# Patient Record
Sex: Female | Born: 1975
Health system: Southern US, Community
[De-identification: ages and names within clinical notes are randomized; demographics above are authoritative.]

## PROBLEM LIST (undated history)

## (undated) DIAGNOSIS — F329 Major depressive disorder, single episode, unspecified: Secondary | ICD-10-CM

## (undated) DIAGNOSIS — F32A Depression, unspecified: Secondary | ICD-10-CM

## (undated) HISTORY — DX: Depression, unspecified: F32.A

## (undated) HISTORY — PX: DILATION AND CURETTAGE OF UTERUS: SHX78

## (undated) HISTORY — PX: WISDOM TOOTH EXTRACTION: SHX21

## (undated) HISTORY — PX: PILONIDAL CYST EXCISION: SHX744

## (undated) HISTORY — DX: Major depressive disorder, single episode, unspecified: F32.9

---

## 2011-08-14 ENCOUNTER — Inpatient Hospital Stay: Payer: Self-pay

## 2014-01-29 ENCOUNTER — Ambulatory Visit: Payer: Self-pay | Admitting: Obstetrics and Gynecology

## 2014-01-29 LAB — CBC WITH DIFFERENTIAL/PLATELET
BASOS PCT: 0.6 %
Basophil #: 0.1 10*3/uL (ref 0.0–0.1)
EOS ABS: 0 10*3/uL (ref 0.0–0.7)
Eosinophil %: 0.2 %
HCT: 38.5 % (ref 35.0–47.0)
HGB: 12.5 g/dL (ref 12.0–16.0)
Lymphocyte #: 1.5 10*3/uL (ref 1.0–3.6)
Lymphocyte %: 12.7 %
MCH: 30.9 pg (ref 26.0–34.0)
MCHC: 32.5 g/dL (ref 32.0–36.0)
MCV: 95 fL (ref 80–100)
Monocyte #: 0.6 x10 3/mm (ref 0.2–0.9)
Monocyte %: 5.5 %
NEUTROS ABS: 9.4 10*3/uL — AB (ref 1.4–6.5)
NEUTROS PCT: 81 %
PLATELETS: 258 10*3/uL (ref 150–440)
RBC: 4.06 10*6/uL (ref 3.80–5.20)
RDW: 13.5 % (ref 11.5–14.5)
WBC: 11.7 10*3/uL — ABNORMAL HIGH (ref 3.6–11.0)

## 2014-01-30 ENCOUNTER — Inpatient Hospital Stay: Payer: Self-pay | Admitting: Obstetrics and Gynecology

## 2014-01-31 LAB — HEMATOCRIT: HCT: 35.3 % (ref 35.0–47.0)

## 2015-04-10 NOTE — Op Note (Signed)
PATIENT NAME:  Adrienne Richards, Adrienne Richards MR#:  [id number] DATE OF BIRTH:  09-03-1976  DATE OF PROCEDURE:  01/30/2014  PREOPERATIVE DIAGNOSES:  1.  Term intrauterine pregnancy at 39 weeks, 3 days gestation.  2.  History of prior birth trauma with fourth degree laceration.   POSTOPERATIVE DIAGNOSES: 1.  Term intrauterine pregnancy at 39 weeks, 3 days gestation.  2.  History of prior birth trauma with fourth degree laceration.   OPERATION PERFORMED: Primary low transverse cesarean section via Pfannenstiel skin incision.   PRIMARY SURGEON: [name], M.D.   ANESTHESIA: Spinal.  PREOPERATIVE ANTIBIOTICS: 2 grams Ancef.   DRAINS OR TUBES: Foley to gravity drainage, On-Q catheter system.   IMPLANTS: None.   ESTIMATED BLOOD LOSS: 800 mL.  OPERATIVE FLUIDS: 1 L of crystalloid.   URINE OUTPUT: 100 mL of clear urine.   SPECIMENS REMOVED: None.   COMPLICATIONS: None.   INTRAOPERATIVE FINDINGS: Normal uterus, ovaries and tubes. Delivery resulted in the birth of a live born female infant weighing 3670 grams, 8 pounds 1 ounce, Apgars 8 and 9.   THE PATIENT CONDITION FOLLOWING PROCEDURE: Stable.   PROCEDURE IN DETAIL: Risks, benefits, and alternatives of the procedure were discussed with the patient prior to proceeding to the operating room. The patient was taken back to the operating room where she was placed under spinal anesthesia. She was positioned in the supine position, prepped and draped in the usual sterile fashion. A timeout procedure was performed. Prior to proceeding with the case, the level of anesthetic was checked and noted to be adequate. A Pfannenstiel skin incision was made 2 cm above the pubic symphysis, carried down sharply to the level of the rectus fascia using the scalpel. The fascia was incised in the midline. The fascial incision was extended using Mayo scissors. The superior border of the rectus fascia was grasped with 2 Kocher clamps. The underlying rectus muscles were  dissected off the rectus fascia bluntly. The inferior portion of the rectus fascia was also dissected off the rectus muscles in a similar fashion. The median raphe was incised using Mayo scissors. The midline was identified and entered bluntly.   Once the peritoneum had been entered, the peritoneal incision was extended using manual traction. A bladder blade was placed. The uterus was noted to be in non-rotated position. A low transverse incision was made on the uterus using the scalpel. The uterus was entered bluntly using a hemostat. The hysterotomy incision was extended using manual traction. Amniotomy was performed using an Allis clamp noting clear fluid. Placing the operator's hand into the hysterotomy incision, the infant was noted to be in the OA position. The vertex was grasped, flexed, brought to the incision and delivered atraumatically using fundal pressure. The remainder of the body delivered with ease. The infant was suctioned, cord was clamped and cut and the infant was passed to the awaiting pediatricians. Cord blood was obtained. The placenta was delivered using manual extraction. The uterus was then exteriorized, wiped clean of clots and debris. The hysterotomy incision was repaired using a two layer closure with #1 Vicryl with the first being a running locked, the second a horizontal imbricating. Following closure of the hysterotomy, the uterus was returned to the abdomen. The peritoneal gutters were wiped clean of clots and debris using two moist laps. The hysterotomy incision was reinspected and noted to be hemostatic. The superior border of the rectus fascia was grasped with a Kocher clamp. The On-Q catheters were placed subfascially per the usual protocol. The  fascia was closed using a looped #1 PDS in a running fashion. Following fascial closure, the incision was irrigated and hemostasis achieved using the Bovie. Skin was closed using Insorb staples. The incision was dressed with Dermabond.  Each On-Q catheter was bolused with 5 mL of 0.5% bupivacaine each. Sponge, needle, and instrument counts were correct x 2. The patient tolerated the procedure well and was taken to the recovery room in stable condition.   ____________________________ Florina OuAndreas M. Bonney AidStaebler, MD ams:aw D: 01/30/2014 11:16:46 ET T: 01/30/2014 11:38:17 ET JOB#: [id number]  cc: Florina OuAndreas M. Bonney AidStaebler, MD, <Dictator> Carmel SacramentoANDREAS Cathrine MusterM Fani Rotondo MD ELECTRONICALLY SIGNED 02/04/2014 1:00

## 2016-05-29 DIAGNOSIS — N912 Amenorrhea, unspecified: Secondary | ICD-10-CM | POA: Diagnosis not present

## 2016-05-29 DIAGNOSIS — L298 Other pruritus: Secondary | ICD-10-CM | POA: Diagnosis not present

## 2016-06-15 DIAGNOSIS — L282 Other prurigo: Secondary | ICD-10-CM | POA: Diagnosis not present

## 2016-06-15 DIAGNOSIS — Z7689 Persons encountering health services in other specified circumstances: Secondary | ICD-10-CM | POA: Diagnosis not present

## 2016-06-15 DIAGNOSIS — B029 Zoster without complications: Secondary | ICD-10-CM | POA: Diagnosis not present

## 2016-06-30 DIAGNOSIS — R21 Rash and other nonspecific skin eruption: Secondary | ICD-10-CM | POA: Diagnosis not present

## 2016-07-19 DIAGNOSIS — O09529 Supervision of elderly multigravida, unspecified trimester: Secondary | ICD-10-CM | POA: Diagnosis not present

## 2016-07-19 DIAGNOSIS — Z113 Encounter for screening for infections with a predominantly sexual mode of transmission: Secondary | ICD-10-CM | POA: Diagnosis not present

## 2016-07-19 DIAGNOSIS — Z36 Encounter for antenatal screening of mother: Secondary | ICD-10-CM | POA: Diagnosis not present

## 2016-07-19 DIAGNOSIS — Z3490 Encounter for supervision of normal pregnancy, unspecified, unspecified trimester: Secondary | ICD-10-CM | POA: Diagnosis not present

## 2016-07-19 DIAGNOSIS — Z3A01 Less than 8 weeks gestation of pregnancy: Secondary | ICD-10-CM | POA: Diagnosis not present

## 2016-07-19 DIAGNOSIS — O09899 Supervision of other high risk pregnancies, unspecified trimester: Secondary | ICD-10-CM | POA: Diagnosis not present

## 2016-07-21 DIAGNOSIS — Z3490 Encounter for supervision of normal pregnancy, unspecified, unspecified trimester: Secondary | ICD-10-CM | POA: Diagnosis not present

## 2016-07-27 DIAGNOSIS — O09899 Supervision of other high risk pregnancies, unspecified trimester: Secondary | ICD-10-CM | POA: Diagnosis not present

## 2016-07-27 DIAGNOSIS — O200 Threatened abortion: Secondary | ICD-10-CM | POA: Diagnosis not present

## 2016-07-27 DIAGNOSIS — Z3A08 8 weeks gestation of pregnancy: Secondary | ICD-10-CM | POA: Diagnosis not present

## 2016-07-27 DIAGNOSIS — O09521 Supervision of elderly multigravida, first trimester: Secondary | ICD-10-CM | POA: Diagnosis not present

## 2016-08-02 DIAGNOSIS — Z3A09 9 weeks gestation of pregnancy: Secondary | ICD-10-CM | POA: Diagnosis not present

## 2016-08-02 DIAGNOSIS — O200 Threatened abortion: Secondary | ICD-10-CM | POA: Diagnosis not present

## 2016-08-03 ENCOUNTER — Encounter
Admission: RE | Admit: 2016-08-03 | Discharge: 2016-08-03 | Disposition: A | Discharge status: Home / Self Care | Payer: BLUE CROSS/BLUE SHIELD | Source: Ambulatory Visit | Attending: Obstetrics and Gynecology | Admitting: Obstetrics and Gynecology

## 2016-08-03 DIAGNOSIS — Z01810 Encounter for preprocedural cardiovascular examination: Secondary | ICD-10-CM | POA: Insufficient documentation

## 2016-08-03 DIAGNOSIS — O021 Missed abortion: Secondary | ICD-10-CM | POA: Diagnosis not present

## 2016-08-03 LAB — CBC
HEMATOCRIT: 38.9 % (ref 35.0–47.0)
HEMOGLOBIN: 13.8 g/dL (ref 12.0–16.0)
MCH: 33.3 pg (ref 26.0–34.0)
MCHC: 35.3 g/dL (ref 32.0–36.0)
MCV: 94.1 fL (ref 80.0–100.0)
Platelets: 296 10*3/uL (ref 150–440)
RBC: 4.14 MIL/uL (ref 3.80–5.20)
RDW: 14.5 % (ref 11.5–14.5)
WBC: 9.5 10*3/uL (ref 3.6–11.0)

## 2016-08-03 LAB — COMPREHENSIVE METABOLIC PANEL
ALBUMIN: 4.1 g/dL (ref 3.5–5.0)
ALK PHOS: 35 U/L — AB (ref 38–126)
ALT: 9 U/L — AB (ref 14–54)
ANION GAP: 8 (ref 5–15)
AST: 21 U/L (ref 15–41)
BUN: 10 mg/dL (ref 6–20)
CALCIUM: 9 mg/dL (ref 8.9–10.3)
CO2: 25 mmol/L (ref 22–32)
CREATININE: 0.51 mg/dL (ref 0.44–1.00)
Chloride: 104 mmol/L (ref 101–111)
GFR calc non Af Amer: >60 mL/min (ref >60)
GLUCOSE: 78 mg/dL (ref 65–99)
Potassium: 3.9 mmol/L (ref 3.5–5.1)
Sodium: 137 mmol/L (ref 135–145)
Total Bilirubin: 0.6 mg/dL (ref 0.3–1.2)
Total Protein: 7.2 g/dL (ref 6.5–8.1)

## 2016-08-03 LAB — TYPE AND SCREEN
ABO/RH(D): A NEG
ANTIBODY SCREEN: NEGATIVE
Extend sample reason: UNDETERMINED

## 2016-08-03 NOTE — Patient Instructions (Signed)
  Your procedure is scheduled on: 08/08/16 Tues. Report to Day Surgery. To find out your arrival time please call [phone] between 1PM - 3PM on Mon. 08/07/16.  Remember: Instructions that are not followed completely may result in serious medical risk, up to and including death, or upon the discretion of your surgeon and anesthesiologist your surgery may need to be rescheduled.    _x___ 1. Do not eat food or drink liquids after midnight. No gum chewing or hard candies.     _x___ 2. No Alcohol for 24 hours before or after surgery.   ____ 3. Do Not Smoke For 24 Hours Prior to Your Surgery.   ____ 4. Bring all medications with you on the day of surgery if instructed.    __x__ 5. Notify your doctor if there is any change in your medical condition     (cold, fever, infections).       Do not wear jewelry, make-up, hairpins, clips or nail polish.  Do not wear lotions, powders, or perfumes. You may wear deodorant.  Do not shave 48 hours prior to surgery. Men may shave face and neck.  Do not bring valuables to the hospital.    [facility] responsible for any belongings or valuables.               Contacts, dentures or bridgework may not be worn into surgery.  Leave your suitcase in the car. After surgery it may be brought to your room.  For patients admitted to the hospital, discharge time is determined by your                treatment team.   Patients discharged the day of surgery will not be allowed to drive home.   Please read over the following fact sheets that you were given:   Surgical Site Infection Prevention   ____ Take these medicines the morning of surgery with A SIP OF WATER:    1.   2.   3.   4.  5.  6.  ____ Fleet Enema (as directed)   ____ Use CHG Soap as directed  ____ Use inhalers on the day of surgery  ____ Stop metformin 2 days prior to surgery    ____ Take 1/2 of usual insulin dose the night before surgery and none on the morning of surgery.    ____ Stop Coumadin/Plavix/aspirin on   ____ Stop Anti-inflammatories on    ____ Stop supplements until after surgery.    ____ Bring C-Pap to the hospital.

## 2016-08-08 ENCOUNTER — Ambulatory Visit: Payer: BLUE CROSS/BLUE SHIELD | Admitting: Anesthesiology

## 2016-08-08 ENCOUNTER — Ambulatory Visit
Admission: RE | Admit: 2016-08-08 | Discharge: 2016-08-08 | Disposition: A | Discharge status: Home / Self Care | Payer: BLUE CROSS/BLUE SHIELD | Source: Ambulatory Visit | Attending: Obstetrics and Gynecology | Admitting: Obstetrics and Gynecology

## 2016-08-08 ENCOUNTER — Encounter
Admission: RE | Disposition: A | Discharge status: Home / Self Care | Payer: Self-pay | Source: Ambulatory Visit | Attending: Obstetrics and Gynecology

## 2016-08-08 DIAGNOSIS — Z885 Allergy status to narcotic agent status: Secondary | ICD-10-CM | POA: Diagnosis not present

## 2016-08-08 DIAGNOSIS — O021 Missed abortion: Secondary | ICD-10-CM | POA: Insufficient documentation

## 2016-08-08 DIAGNOSIS — Z8249 Family history of ischemic heart disease and other diseases of the circulatory system: Secondary | ICD-10-CM | POA: Diagnosis not present

## 2016-08-08 DIAGNOSIS — Z79899 Other long term (current) drug therapy: Secondary | ICD-10-CM | POA: Diagnosis not present

## 2016-08-08 DIAGNOSIS — O020 Blighted ovum and nonhydatidiform mole: Secondary | ICD-10-CM | POA: Insufficient documentation

## 2016-08-08 DIAGNOSIS — Z888 Allergy status to other drugs, medicaments and biological substances status: Secondary | ICD-10-CM | POA: Diagnosis not present

## 2016-08-08 DIAGNOSIS — Z9889 Other specified postprocedural states: Secondary | ICD-10-CM

## 2016-08-08 HISTORY — PX: DILATION AND EVACUATION: SHX1459

## 2016-08-08 LAB — TYPE AND SCREEN
ABO/RH(D): A NEG
ANTIBODY SCREEN: NEGATIVE

## 2016-08-08 SURGERY — DILATION AND EVACUATION, UTERUS
Anesthesia: General

## 2016-08-08 MED ORDER — HYDROCODONE-ACETAMINOPHEN 5-325 MG PO TABS: 1.0000 | ORAL_TABLET | Freq: Four times a day (QID) | ORAL | 0 refills | Status: DC | PRN

## 2016-08-08 MED ORDER — FAMOTIDINE 20 MG PO TABS
ORAL_TABLET | ORAL | Status: AC
  Administered 2016-08-08: 20 mg via ORAL
  Filled 2016-08-08: qty 1

## 2016-08-08 MED ORDER — DEXAMETHASONE SODIUM PHOSPHATE 10 MG/ML IJ SOLN
INTRAMUSCULAR | Status: DC | PRN
  Administered 2016-08-08: 10 mg via INTRAVENOUS

## 2016-08-08 MED ORDER — FENTANYL CITRATE (PF) 100 MCG/2ML IJ SOLN
25.0000 ug | INTRAMUSCULAR | Status: DC | PRN
  Administered 2016-08-08 (×2): 25 ug via INTRAVENOUS

## 2016-08-08 MED ORDER — OXYCODONE HCL 5 MG PO TABS: 5.0000 mg | ORAL_TABLET | Freq: Once | ORAL | Status: DC | PRN

## 2016-08-08 MED ORDER — LACTATED RINGERS IV SOLN
INTRAVENOUS | Status: DC
  Administered 2016-08-08: 09:00:00 via INTRAVENOUS

## 2016-08-08 MED ORDER — DOXYCYCLINE HYCLATE 100 MG IV SOLR
200.0000 mg | Freq: Once | INTRAVENOUS | Status: AC
  Administered 2016-08-08: 200 mg via INTRAVENOUS
  Filled 2016-08-08: qty 200

## 2016-08-08 MED ORDER — MIDAZOLAM HCL 2 MG/2ML IJ SOLN
INTRAMUSCULAR | Status: DC | PRN
  Administered 2016-08-08: 2 mg via INTRAVENOUS

## 2016-08-08 MED ORDER — RHO D IMMUNE GLOBULIN 1500 UNIT/2ML IJ SOSY
300.0000 ug | PREFILLED_SYRINGE | Freq: Once | INTRAMUSCULAR | Status: AC
  Administered 2016-08-08: 300 ug via INTRAMUSCULAR
  Filled 2016-08-08: qty 2

## 2016-08-08 MED ORDER — OXYCODONE HCL 5 MG/5ML PO SOLN: 5.0000 mg | Freq: Once | ORAL | Status: DC | PRN

## 2016-08-08 MED ORDER — DOXYCYCLINE HYCLATE 100 MG PO TABS
100.0000 mg | ORAL_TABLET | Freq: Once | ORAL | Status: AC
  Administered 2016-08-08: 100 mg via ORAL
  Filled 2016-08-08: qty 1

## 2016-08-08 MED ORDER — PROPOFOL 10 MG/ML IV BOLUS
INTRAVENOUS | Status: DC | PRN
  Administered 2016-08-08: 150 mg via INTRAVENOUS

## 2016-08-08 MED ORDER — LIDOCAINE HCL (CARDIAC) 20 MG/ML IV SOLN
INTRAVENOUS | Status: DC | PRN
  Administered 2016-08-08: 30 mg via INTRAVENOUS

## 2016-08-08 MED ORDER — IBUPROFEN 600 MG PO TABS: 600.0000 mg | ORAL_TABLET | Freq: Four times a day (QID) | ORAL | 3 refills | Status: DC | PRN

## 2016-08-08 MED ORDER — FENTANYL CITRATE (PF) 100 MCG/2ML IJ SOLN
INTRAMUSCULAR | Status: AC
  Administered 2016-08-08: 25 ug via INTRAVENOUS
  Filled 2016-08-08: qty 2

## 2016-08-08 MED ORDER — PROMETHAZINE HCL 25 MG/ML IJ SOLN: 6.2500 mg | INTRAMUSCULAR | Status: DC | PRN

## 2016-08-08 MED ORDER — FENTANYL CITRATE (PF) 100 MCG/2ML IJ SOLN
INTRAMUSCULAR | Status: DC | PRN
  Administered 2016-08-08: 50 ug via INTRAVENOUS

## 2016-08-08 MED ORDER — FAMOTIDINE 20 MG PO TABS
20.0000 mg | ORAL_TABLET | Freq: Once | ORAL | Status: AC
  Administered 2016-08-08: 20 mg via ORAL

## 2016-08-08 MED ORDER — ONDANSETRON HCL 4 MG/2ML IJ SOLN
INTRAMUSCULAR | Status: DC | PRN
  Administered 2016-08-08: 4 mg via INTRAVENOUS

## 2016-08-08 SURGICAL SUPPLY — 18 items
CATH ROBINSON RED A/P 16FR (CATHETERS) ×2 IMPLANT
FILTER UTR ASPR SPEC (MISCELLANEOUS) ×1 IMPLANT
FLTR UTR ASPR SPEC (MISCELLANEOUS) ×2
GLOVE BIO SURGEON STRL SZ7 (GLOVE) ×12 IMPLANT
GOWN STRL REUS W/ TWL LRG LVL3 (GOWN DISPOSABLE) ×2 IMPLANT
GOWN STRL REUS W/TWL LRG LVL3 (GOWN DISPOSABLE) ×2
KIT BERKELEY 1ST TRIMESTER 3/8 (MISCELLANEOUS) ×2 IMPLANT
KIT RM TURNOVER CYSTO AR (KITS) ×2 IMPLANT
NS IRRIG 500ML POUR BTL (IV SOLUTION) ×2 IMPLANT
PACK DNC HYST (MISCELLANEOUS) ×2 IMPLANT
PAD OB MATERNITY 4.3X12.25 (PERSONAL CARE ITEMS) ×2 IMPLANT
PAD PREP 24X41 OB/GYN DISP (PERSONAL CARE ITEMS) ×2 IMPLANT
SET BERKELEY SUCTION TUBING (SUCTIONS) ×2 IMPLANT
TOWEL OR 17X26 4PK STRL BLUE (TOWEL DISPOSABLE) ×2 IMPLANT
VACURETTE 10 RIGID CVD (CANNULA) IMPLANT
VACURETTE 12 RIGID CVD (CANNULA) IMPLANT
VACURETTE 8 RIGID CVD (CANNULA) IMPLANT
VACURETTE 8MM F TIP (MISCELLANEOUS) ×2 IMPLANT

## 2016-08-08 NOTE — Op Note (Signed)
Patient Name: Donne HazelRobin J Claude Date of Procedure: 08/08/16  Preoperative Diagnosis: 1) 40 y.o. with 6 week blighted ovum  Postoperative Diagnosis: 1) 40 y.o. with 6 week blighted ovum  Operation Performed: Suction dilation and curettage  Indication: Blighted ovum  Anesthesia: General  Primary Surgeon: Vena AustriaAndreas Kijana Estock, MD  Assistant: none  Preoperative Antibiotics: 200mg  doxycyline  Estimated Blood Loss: * No blood loss amount entered *  IV Fluids: 300mL  Urine Output:: ~2020mL straight cath  Drains or Tubes: none  Implants: none  Specimens Removed: products of conception  Complications: none  Intraoperative Findings: minimally enlarged retroverted uterus.  Moderate amount of tissue  Patient Condition: stable  Procedure in Detail:  Patient was taken to the operating room were she was administered general endotracheal anesthesia.  She was positioned in the dorsal lithotomy position utilizing candy cane stirups, prepped and draped in the usual sterile fashion.  Uterus was noted to be minimally enlarged in size, retroverted.   Prior to proceeding with the case a time out was performed.  Attention was turned to the patient's pelvis.  A red rubber catheter was used to empty the patient's bladder.  An operative speculum was placed to allow visualization of the cervix.  The anterior lip of the cervix was grasped with a single tooth tenaculum and the cervix was sequentially dilated using pratt dilators.  A size 8 flexible suction curette was advanced to the uterine fundus, and several passes taken.  Sharp curettage was performed noting good uterine cry throughout the cavity.  A final pass of the suction curette was then undertaken..    The single tooth tenaculum was removed from the cervix.  The tenaculum sites and cervix were noted to be hemostatic before removing the operative speculum.  Sponge needle and instrument counts were corrects times two.  The patient tolerated the procedure  well and was taken to the recovery room in stable condition.

## 2016-08-08 NOTE — Anesthesia Procedure Notes (Signed)
Procedure Name: LMA Insertion Date/Time: 08/08/2016 9:58 AM Performed by: Omer JackWEATHERLY, Adrienne Knock Pre-anesthesia Checklist: Patient identified, Patient being monitored, Timeout performed, Emergency Drugs available and Suction available Patient Re-evaluated:Patient Re-evaluated prior to inductionOxygen Delivery Method: Circle system utilized Preoxygenation: Pre-oxygenation with 100% oxygen Intubation Type: IV induction Ventilation: Mask ventilation without difficulty LMA: LMA inserted LMA Size: 3.5 Tube type: Oral Number of attempts: 1 Placement Confirmation: positive ETCO2 and breath sounds checked- equal and bilateral Tube secured with: Tape Dental Injury: Teeth and Oropharynx as per pre-operative assessment

## 2016-08-08 NOTE — Transfer of Care (Signed)
Immediate Anesthesia Transfer of Care Note  Patient: Adrienne Richards  Procedure(s) Performed: Procedure(s): DILATATION AND EVACUATION (N/A)  Patient Location: PACU  Anesthesia Type:General  Level of Consciousness: patient cooperative and lethargic  Airway & Oxygen Therapy: Patient Spontanous Breathing and Patient connected to face mask oxygen  Post-op Assessment: Report given to RN and Post -op Vital signs reviewed and stable  Post vital signs: Reviewed and stable  Last Vitals:  Vitals:   08/08/16 0804 08/08/16 1035  BP: 103/60 112/73  Pulse: 80 (!) 57  Resp: 14 13  Temp: 36.4 C 36.3 C    Last Pain:  Vitals:   08/08/16 0804  TempSrc: Tympanic         Complications: No apparent anesthesia complications

## 2016-08-08 NOTE — H&P (Signed)
Date of Initial paper H&P: 08/03/16  History reviewed, patient examined, no change in status, stable for surgery.

## 2016-08-08 NOTE — Anesthesia Postprocedure Evaluation (Signed)
Anesthesia Post Note  Patient: Adrienne HazelRobin J Richards  Procedure(s) Performed: Procedure(s) (LRB): DILATATION AND EVACUATION (N/A)  Patient location during evaluation: PACU Anesthesia Type: General Level of consciousness: awake and alert and oriented Pain management: pain level controlled Vital Signs Assessment: post-procedure vital signs reviewed and stable Respiratory status: spontaneous breathing, nonlabored ventilation and respiratory function stable Cardiovascular status: blood pressure returned to baseline and stable Postop Assessment: no signs of nausea or vomiting Anesthetic complications: no    Last Vitals:  Vitals:   08/08/16 1055 08/08/16 1104  BP:  100/78  Pulse: 63 66  Resp: 20 12  Temp:      Last Pain:  Vitals:   08/08/16 1104  TempSrc:   PainSc: 2                  Jacquis Paxton

## 2016-08-08 NOTE — Anesthesia Preprocedure Evaluation (Signed)
Anesthesia Evaluation  Patient identified by MRN, date of birth, ID band Patient awake    Reviewed: Allergy & Precautions, NPO status , Patient's Chart, lab work & pertinent test results  History of Anesthesia Complications Negative for: history of anesthetic complications  Airway Mallampati: II  TM Distance: >3 FB Neck ROM: Full    Dental no notable dental hx.    Pulmonary neg pulmonary ROS, neg sleep apnea,    breath sounds clear to auscultation- rhonchi (-) decreased breath sounds(-) wheezing      Cardiovascular Exercise Tolerance: Good (-) hypertension(-) CAD and (-) Past MI  Rhythm:Regular Rate:Normal - Systolic murmurs and - Diastolic murmurs    Neuro/Psych negative neurological ROS  negative psych ROS   GI/Hepatic negative GI ROS, Neg liver ROS,   Endo/Other  negative endocrine ROSneg diabetes  Renal/GU negative Renal ROS     Musculoskeletal negative musculoskeletal ROS (+)   Abdominal (+) - obese,   Peds  Hematology negative hematology ROS (+)   Anesthesia Other Findings   Reproductive/Obstetrics                             Anesthesia Physical Anesthesia Plan  ASA: I  Anesthesia Plan: General   Post-op Pain Management:    Induction: Intravenous  Airway Management Planned: LMA  Additional Equipment:   Intra-op Plan:   Post-operative Plan:   Informed Consent: I have reviewed the patients History and Physical, chart, labs and discussed the procedure including the risks, benefits and alternatives for the proposed anesthesia with the patient or authorized representative who has indicated his/her understanding and acceptance.   Dental advisory given  Plan Discussed with: CRNA and Anesthesiologist  Anesthesia Plan Comments:         Lab Results  Component Value Date   WBC 9.5 08/03/2016   HGB 13.8 08/03/2016   HCT 38.9 08/03/2016   MCV 94.1 08/03/2016   PLT  296 08/03/2016    Anesthesia Quick Evaluation

## 2016-08-08 NOTE — Discharge Instructions (Signed)
No Intercourse for the first 4 weeks.  Do not drive while taking Vicodin.  Activity as tolerated.   AMBULATORY SURGERY  DISCHARGE INSTRUCTIONS   1) The drugs that you were given will stay in your system until tomorrow so for the next 24 hours you should not:  A) Drive an automobile B) Make any legal decisions C) Drink any alcoholic beverage   2) You may resume regular meals tomorrow.  Today it is better to start with liquids and gradually work up to solid foods.  You may eat anything you prefer, but it is better to start with liquids, then soup and crackers, and gradually work up to solid foods.   3) Please notify your doctor immediately if you have any unusual bleeding, trouble breathing, redness and pain at the surgery site, drainage, fever, or pain not relieved by medication.    4) Additional Instructions:        Please contact your physician with any problems or Same Day Surgery at [phone], Monday through Friday 6 am to 4 pm, or [place] at [facility] Main number at [phone].

## 2016-08-09 LAB — RHOGAM INJECTION: Unit division: 0

## 2016-08-09 LAB — SURGICAL PATHOLOGY

## 2016-09-21 DIAGNOSIS — Z09 Encounter for follow-up examination after completed treatment for conditions other than malignant neoplasm: Secondary | ICD-10-CM | POA: Diagnosis not present

## 2016-09-21 DIAGNOSIS — O039 Complete or unspecified spontaneous abortion without complication: Secondary | ICD-10-CM | POA: Diagnosis not present

## 2016-09-21 DIAGNOSIS — N96 Recurrent pregnancy loss: Secondary | ICD-10-CM | POA: Diagnosis not present

## 2016-10-17 DIAGNOSIS — R946 Abnormal results of thyroid function studies: Secondary | ICD-10-CM | POA: Diagnosis not present

## 2016-10-17 DIAGNOSIS — Z3201 Encounter for pregnancy test, result positive: Secondary | ICD-10-CM | POA: Diagnosis not present

## 2016-10-17 DIAGNOSIS — Z331 Pregnant state, incidental: Secondary | ICD-10-CM | POA: Diagnosis not present

## 2016-10-17 DIAGNOSIS — Z8759 Personal history of other complications of pregnancy, childbirth and the puerperium: Secondary | ICD-10-CM | POA: Diagnosis not present

## 2016-10-19 DIAGNOSIS — O09299 Supervision of pregnancy with other poor reproductive or obstetric history, unspecified trimester: Secondary | ICD-10-CM | POA: Diagnosis not present

## 2016-10-19 DIAGNOSIS — Z3201 Encounter for pregnancy test, result positive: Secondary | ICD-10-CM | POA: Diagnosis not present

## 2016-10-19 DIAGNOSIS — Z331 Pregnant state, incidental: Secondary | ICD-10-CM | POA: Diagnosis not present

## 2016-10-26 DIAGNOSIS — Z3687 Encounter for antenatal screening for uncertain dates: Secondary | ICD-10-CM | POA: Diagnosis not present

## 2016-10-26 DIAGNOSIS — Z3A01 Less than 8 weeks gestation of pregnancy: Secondary | ICD-10-CM | POA: Diagnosis not present

## 2016-11-01 DIAGNOSIS — Z3490 Encounter for supervision of normal pregnancy, unspecified, unspecified trimester: Secondary | ICD-10-CM | POA: Diagnosis not present

## 2016-11-01 DIAGNOSIS — Z113 Encounter for screening for infections with a predominantly sexual mode of transmission: Secondary | ICD-10-CM | POA: Diagnosis not present

## 2016-11-16 DIAGNOSIS — O34219 Maternal care for unspecified type scar from previous cesarean delivery: Secondary | ICD-10-CM | POA: Diagnosis not present

## 2016-11-16 DIAGNOSIS — O09299 Supervision of pregnancy with other poor reproductive or obstetric history, unspecified trimester: Secondary | ICD-10-CM | POA: Diagnosis not present

## 2016-11-16 DIAGNOSIS — O09529 Supervision of elderly multigravida, unspecified trimester: Secondary | ICD-10-CM | POA: Diagnosis not present

## 2016-11-16 DIAGNOSIS — Z3A09 9 weeks gestation of pregnancy: Secondary | ICD-10-CM | POA: Diagnosis not present

## 2016-11-20 DIAGNOSIS — O0991 Supervision of high risk pregnancy, unspecified, first trimester: Secondary | ICD-10-CM | POA: Diagnosis not present

## 2016-11-20 DIAGNOSIS — Z3A09 9 weeks gestation of pregnancy: Secondary | ICD-10-CM | POA: Diagnosis not present

## 2016-12-25 DIAGNOSIS — L2089 Other atopic dermatitis: Secondary | ICD-10-CM | POA: Diagnosis not present

## 2016-12-29 DIAGNOSIS — Z209 Contact with and (suspected) exposure to unspecified communicable disease: Secondary | ICD-10-CM | POA: Diagnosis not present

## 2017-01-02 DIAGNOSIS — O09529 Supervision of elderly multigravida, unspecified trimester: Secondary | ICD-10-CM | POA: Diagnosis not present

## 2017-01-02 DIAGNOSIS — Z3A14 14 weeks gestation of pregnancy: Secondary | ICD-10-CM | POA: Diagnosis not present

## 2017-01-02 DIAGNOSIS — O09899 Supervision of other high risk pregnancies, unspecified trimester: Secondary | ICD-10-CM | POA: Diagnosis not present

## 2017-01-23 DIAGNOSIS — O34219 Maternal care for unspecified type scar from previous cesarean delivery: Secondary | ICD-10-CM | POA: Diagnosis not present

## 2017-01-23 DIAGNOSIS — Z3A19 19 weeks gestation of pregnancy: Secondary | ICD-10-CM | POA: Diagnosis not present

## 2017-01-23 DIAGNOSIS — Z369 Encounter for antenatal screening, unspecified: Secondary | ICD-10-CM | POA: Diagnosis not present

## 2017-02-14 ENCOUNTER — Other Ambulatory Visit: Payer: Self-pay | Admitting: Obstetrics & Gynecology

## 2017-02-14 DIAGNOSIS — Z1389 Encounter for screening for other disorder: Secondary | ICD-10-CM

## 2017-02-20 ENCOUNTER — Ambulatory Visit (INDEPENDENT_AMBULATORY_CARE_PROVIDER_SITE_OTHER): Payer: BLUE CROSS/BLUE SHIELD

## 2017-02-20 ENCOUNTER — Encounter: Payer: Self-pay | Admitting: Obstetrics and Gynecology

## 2017-02-20 ENCOUNTER — Ambulatory Visit (INDEPENDENT_AMBULATORY_CARE_PROVIDER_SITE_OTHER): Payer: BLUE CROSS/BLUE SHIELD | Admitting: Obstetrics and Gynecology

## 2017-02-20 VITALS — BP 112/70 | Wt 162.0 lb

## 2017-02-20 DIAGNOSIS — O0992 Supervision of high risk pregnancy, unspecified, second trimester: Secondary | ICD-10-CM

## 2017-02-20 DIAGNOSIS — O26892 Other specified pregnancy related conditions, second trimester: Secondary | ICD-10-CM

## 2017-02-20 DIAGNOSIS — Z36 Encounter for antenatal screening for chromosomal anomalies: Secondary | ICD-10-CM | POA: Diagnosis not present

## 2017-02-20 DIAGNOSIS — Z3A23 23 weeks gestation of pregnancy: Secondary | ICD-10-CM

## 2017-02-20 DIAGNOSIS — O09523 Supervision of elderly multigravida, third trimester: Secondary | ICD-10-CM | POA: Insufficient documentation

## 2017-02-20 DIAGNOSIS — O09892 Supervision of other high risk pregnancies, second trimester: Secondary | ICD-10-CM

## 2017-02-20 DIAGNOSIS — Z131 Encounter for screening for diabetes mellitus: Secondary | ICD-10-CM

## 2017-02-20 DIAGNOSIS — Z6791 Unspecified blood type, Rh negative: Secondary | ICD-10-CM | POA: Insufficient documentation

## 2017-02-20 DIAGNOSIS — O0993 Supervision of high risk pregnancy, unspecified, third trimester: Secondary | ICD-10-CM | POA: Insufficient documentation

## 2017-02-20 DIAGNOSIS — Z113 Encounter for screening for infections with a predominantly sexual mode of transmission: Secondary | ICD-10-CM

## 2017-02-20 DIAGNOSIS — O09522 Supervision of elderly multigravida, second trimester: Secondary | ICD-10-CM

## 2017-02-20 DIAGNOSIS — Z1389 Encounter for screening for other disorder: Secondary | ICD-10-CM

## 2017-02-20 DIAGNOSIS — Z3A24 24 weeks gestation of pregnancy: Secondary | ICD-10-CM | POA: Diagnosis not present

## 2017-02-20 DIAGNOSIS — O26893 Other specified pregnancy related conditions, third trimester: Secondary | ICD-10-CM | POA: Insufficient documentation

## 2017-02-20 NOTE — Progress Notes (Signed)
Repeat anatomy scan today. 

## 2017-02-20 NOTE — Progress Notes (Addendum)
Anatomy screen complete today. Schedule 28 week labs nv.  No issues today. Discussed TOLAC vs C-section. Still deciding Addendum: patient has changed her mind and would like C-Section.  Prefers Dr. Bonney AidStaebler. Will go ahead and try to set this up.

## 2017-02-27 ENCOUNTER — Telehealth: Payer: Self-pay | Admitting: Obstetrics and Gynecology

## 2017-02-27 NOTE — Telephone Encounter (Signed)
I contacted the patient to schedule her H&P, Pre-Admit Testing, and OR appts. She said she and her husband were considering changing to 06/08/17 w/ Dr. Tiburcio PeaHarris. I gave the patient my phone# and ext and she will call back when a decision is made.

## 2017-02-27 NOTE — Telephone Encounter (Signed)
-----   Message from Conard NovakStephen D Jackson, MD sent at 02/27/2017  1:00 PM EDT ----- Regarding: Schedule Surgery Surgery Booking Request Patient Full Name: Adrienne BiRobin Richards MRN: [id number]  DOB: 09/11/1972  Surgeon: Vena AustriaAndreas Staebler, MD  Requested Surgery Date and Time: 06/12/1017 Primary Diagnosis and Code:  Previous cesarean section, desires repeat (L&D aware)  Secondary Diagnosis and Code: none Surgical Procedure: Repeat Cesarean section Admission Status: Inpatient Length of Surgery: 1 hour Special Case Needs: none H&P: TBD (date) Phone Interview or Office Pre-Admit: TBD Interpreter: n/a Language: n/a Medical Clearance: n/a Special Scheduling Instructions: none

## 2017-02-27 NOTE — Telephone Encounter (Signed)
-----   Message from Stephen D Jackson, MD sent at 02/27/2017  1:00 PM EDT ----- °Regarding: Schedule Surgery °Surgery Booking Request °Patient Full Name: Adrienne Richards °MRN: [id number]  °DOB: 09/11/1972  °Surgeon: Andreas Staebler, MD  °Requested Surgery Date and Time: 06/12/1017 °Primary Diagnosis and Code:  °Previous cesarean section, desires repeat (L&D aware) ° °Secondary Diagnosis and Code: none °Surgical Procedure: Repeat Cesarean section °Admission Status: Inpatient °Length of Surgery: 1 hour °Special Case Needs: none °H&P: TBD (date) °Phone Interview or Office Pre-Admit: TBD °Interpreter: n/a °Language: n/a °Medical Clearance: n/a °Special Scheduling Instructions: none  °

## 2017-02-28 NOTE — Telephone Encounter (Signed)
Patient has decided to stay with Dr Bonney AidStaebler on 06/12/17. Patient is aware of 6/22/185 8:30am H&P with Dr Bonney AidStaebler, 06/11/17 9:15 or 9:30am Pre-Admit Testing, and 06/12/17 OR.

## 2017-03-21 ENCOUNTER — Ambulatory Visit (INDEPENDENT_AMBULATORY_CARE_PROVIDER_SITE_OTHER): Payer: BLUE CROSS/BLUE SHIELD | Admitting: Obstetrics and Gynecology

## 2017-03-21 ENCOUNTER — Encounter: Payer: Self-pay | Admitting: Obstetrics and Gynecology

## 2017-03-21 ENCOUNTER — Other Ambulatory Visit: Payer: BLUE CROSS/BLUE SHIELD

## 2017-03-21 VITALS — BP 116/70 | Wt 164.0 lb

## 2017-03-21 DIAGNOSIS — O0993 Supervision of high risk pregnancy, unspecified, third trimester: Secondary | ICD-10-CM

## 2017-03-21 DIAGNOSIS — Z131 Encounter for screening for diabetes mellitus: Secondary | ICD-10-CM | POA: Diagnosis not present

## 2017-03-21 DIAGNOSIS — O26892 Other specified pregnancy related conditions, second trimester: Secondary | ICD-10-CM

## 2017-03-21 DIAGNOSIS — O09523 Supervision of elderly multigravida, third trimester: Secondary | ICD-10-CM

## 2017-03-21 DIAGNOSIS — O09893 Supervision of other high risk pregnancies, third trimester: Secondary | ICD-10-CM | POA: Diagnosis not present

## 2017-03-21 DIAGNOSIS — Z3A27 27 weeks gestation of pregnancy: Secondary | ICD-10-CM

## 2017-03-21 DIAGNOSIS — O26893 Other specified pregnancy related conditions, third trimester: Secondary | ICD-10-CM

## 2017-03-21 DIAGNOSIS — Z6791 Unspecified blood type, Rh negative: Secondary | ICD-10-CM

## 2017-03-21 DIAGNOSIS — Z113 Encounter for screening for infections with a predominantly sexual mode of transmission: Secondary | ICD-10-CM | POA: Diagnosis not present

## 2017-03-21 DIAGNOSIS — O09892 Supervision of other high risk pregnancies, second trimester: Secondary | ICD-10-CM | POA: Diagnosis not present

## 2017-03-21 DIAGNOSIS — O09522 Supervision of elderly multigravida, second trimester: Secondary | ICD-10-CM

## 2017-03-21 MED ORDER — RHO D IMMUNE GLOBULIN 1500 UNIT/2ML IJ SOSY
300.0000 ug | PREFILLED_SYRINGE | Freq: Once | INTRAMUSCULAR | Status: AC
  Administered 2017-03-21: 300 ug via INTRAMUSCULAR

## 2017-03-21 MED ORDER — TETANUS-DIPHTH-ACELL PERTUSSIS 5-2.5-18.5 LF-MCG/0.5 IM SUSP: 0.5000 mL | Freq: Once | INTRAMUSCULAR | Status: DC

## 2017-03-21 NOTE — Progress Notes (Signed)
28 week labs today. No vb. No lof.  TDaP today, Rhogam today.

## 2017-03-21 NOTE — Addendum Note (Signed)
Addended by: Acquanetta Sit on: 03/21/2017 09:29 AM   Modules accepted: Orders

## 2017-03-22 LAB — 28 WEEKS RH-PANEL
ANTIBODY SCREEN: NEGATIVE
BASOS: 0 %
Basophils Absolute: 0 10*3/uL (ref 0.0–0.2)
EOS (ABSOLUTE): 0.1 10*3/uL (ref 0.0–0.4)
EOS: 1 %
Gestational Diabetes Screen: 104 mg/dL (ref 65–139)
HIV SCREEN 4TH GENERATION: NONREACTIVE
Hematocrit: 32.7 % — ABNORMAL LOW (ref 34.0–46.6)
Hemoglobin: 11.2 g/dL (ref 11.1–15.9)
IMMATURE GRANS (ABS): 0.1 10*3/uL (ref 0.0–0.1)
Immature Granulocytes: 1 %
LYMPHS: 15 %
Lymphocytes Absolute: 1.4 10*3/uL (ref 0.7–3.1)
MCH: 31.5 pg (ref 26.6–33.0)
MCHC: 34.3 g/dL (ref 31.5–35.7)
MCV: 92 fL (ref 79–97)
MONOCYTES: 3 %
Monocytes Absolute: 0.3 10*3/uL (ref 0.1–0.9)
NEUTROS ABS: 7.7 10*3/uL — AB (ref 1.4–7.0)
NEUTROS PCT: 80 %
PLATELETS: 286 10*3/uL (ref 150–379)
RBC: 3.56 x10E6/uL — ABNORMAL LOW (ref 3.77–5.28)
RDW: 13.4 % (ref 12.3–15.4)
RPR Ser Ql: NONREACTIVE
WBC: 9.5 10*3/uL (ref 3.4–10.8)

## 2017-03-23 ENCOUNTER — Encounter: Payer: Self-pay | Admitting: Obstetrics and Gynecology

## 2017-04-04 ENCOUNTER — Ambulatory Visit (INDEPENDENT_AMBULATORY_CARE_PROVIDER_SITE_OTHER): Payer: BLUE CROSS/BLUE SHIELD | Admitting: Obstetrics and Gynecology

## 2017-04-04 DIAGNOSIS — O09523 Supervision of elderly multigravida, third trimester: Secondary | ICD-10-CM

## 2017-04-04 DIAGNOSIS — O0993 Supervision of high risk pregnancy, unspecified, third trimester: Secondary | ICD-10-CM

## 2017-04-04 DIAGNOSIS — Z3A29 29 weeks gestation of pregnancy: Secondary | ICD-10-CM

## 2017-04-04 NOTE — Progress Notes (Signed)
Lump under right arm/pressure

## 2017-04-04 NOTE — Progress Notes (Signed)
Area of concern right axilla consistent with accessory breast tissue, monitor

## 2017-04-18 ENCOUNTER — Ambulatory Visit (INDEPENDENT_AMBULATORY_CARE_PROVIDER_SITE_OTHER): Payer: BLUE CROSS/BLUE SHIELD | Admitting: Advanced Practice Midwife

## 2017-04-18 VITALS — BP 120/70 | Wt 167.0 lb

## 2017-04-18 DIAGNOSIS — Z3A31 31 weeks gestation of pregnancy: Secondary | ICD-10-CM

## 2017-04-18 NOTE — Progress Notes (Signed)
Doing well. Considering BTL at time of c/s. Discussed procedure, recovery, risks/benefits. She will discuss with her husband. No additional right breast concerns today.

## 2017-05-02 ENCOUNTER — Ambulatory Visit (INDEPENDENT_AMBULATORY_CARE_PROVIDER_SITE_OTHER): Payer: BLUE CROSS/BLUE SHIELD | Admitting: Obstetrics and Gynecology

## 2017-05-02 VITALS — BP 110/62 | Wt 172.0 lb

## 2017-05-02 DIAGNOSIS — Z3A33 33 weeks gestation of pregnancy: Secondary | ICD-10-CM

## 2017-05-02 DIAGNOSIS — O0993 Supervision of high risk pregnancy, unspecified, third trimester: Secondary | ICD-10-CM

## 2017-05-02 NOTE — Progress Notes (Signed)
Intense braxton hicks

## 2017-05-18 ENCOUNTER — Ambulatory Visit (INDEPENDENT_AMBULATORY_CARE_PROVIDER_SITE_OTHER): Payer: BLUE CROSS/BLUE SHIELD | Admitting: Obstetrics and Gynecology

## 2017-05-18 VITALS — BP 102/66 | Wt 174.0 lb

## 2017-05-18 DIAGNOSIS — Z3A36 36 weeks gestation of pregnancy: Secondary | ICD-10-CM

## 2017-05-18 DIAGNOSIS — O09523 Supervision of elderly multigravida, third trimester: Secondary | ICD-10-CM

## 2017-05-18 DIAGNOSIS — Z3685 Encounter for antenatal screening for Streptococcus B: Secondary | ICD-10-CM | POA: Diagnosis not present

## 2017-05-18 DIAGNOSIS — O0993 Supervision of high risk pregnancy, unspecified, third trimester: Secondary | ICD-10-CM

## 2017-05-18 NOTE — Progress Notes (Signed)
[ ]   GBS

## 2017-05-18 NOTE — Addendum Note (Signed)
Addended by: Lorrene ReidSTAEBLER, Mkayla Steele M on: 05/18/2017 08:40 AM   Modules accepted: Orders

## 2017-05-20 ENCOUNTER — Encounter: Payer: Self-pay | Admitting: Obstetrics and Gynecology

## 2017-05-20 LAB — STREP GP B NAA: Strep Gp B NAA: POSITIVE — AB

## 2017-05-25 ENCOUNTER — Ambulatory Visit (INDEPENDENT_AMBULATORY_CARE_PROVIDER_SITE_OTHER): Payer: BLUE CROSS/BLUE SHIELD | Admitting: Obstetrics and Gynecology

## 2017-05-25 DIAGNOSIS — Z3A37 37 weeks gestation of pregnancy: Secondary | ICD-10-CM

## 2017-05-25 DIAGNOSIS — Z6791 Unspecified blood type, Rh negative: Secondary | ICD-10-CM

## 2017-05-25 DIAGNOSIS — O09893 Supervision of other high risk pregnancies, third trimester: Secondary | ICD-10-CM

## 2017-05-25 DIAGNOSIS — O0993 Supervision of high risk pregnancy, unspecified, third trimester: Secondary | ICD-10-CM

## 2017-05-25 DIAGNOSIS — O26893 Other specified pregnancy related conditions, third trimester: Secondary | ICD-10-CM

## 2017-05-25 DIAGNOSIS — O09523 Supervision of elderly multigravida, third trimester: Secondary | ICD-10-CM

## 2017-05-25 NOTE — Progress Notes (Signed)
No vb. No lof.  

## 2017-05-31 ENCOUNTER — Encounter: Payer: Self-pay | Admitting: Obstetrics and Gynecology

## 2017-06-04 ENCOUNTER — Ambulatory Visit (INDEPENDENT_AMBULATORY_CARE_PROVIDER_SITE_OTHER): Payer: BLUE CROSS/BLUE SHIELD | Admitting: Advanced Practice Midwife

## 2017-06-04 VITALS — BP 120/80 | Wt 183.0 lb

## 2017-06-04 DIAGNOSIS — Z3A38 38 weeks gestation of pregnancy: Secondary | ICD-10-CM

## 2017-06-04 NOTE — Progress Notes (Signed)
Doing well- minor discomforts of third trimester. Good fetal movement. No LOF, VB. Occasional painful ctx, mostly BH. Labor precautions reviewed. Has pre-op appt scheduled for this Friday.

## 2017-06-08 ENCOUNTER — Ambulatory Visit (INDEPENDENT_AMBULATORY_CARE_PROVIDER_SITE_OTHER): Payer: BLUE CROSS/BLUE SHIELD | Admitting: Obstetrics and Gynecology

## 2017-06-08 ENCOUNTER — Encounter: Payer: Self-pay | Admitting: Obstetrics and Gynecology

## 2017-06-08 VITALS — BP 124/74 | HR 76 | Ht 63.0 in | Wt 187.0 lb

## 2017-06-08 DIAGNOSIS — Z3A38 38 weeks gestation of pregnancy: Secondary | ICD-10-CM

## 2017-06-08 DIAGNOSIS — O09523 Supervision of elderly multigravida, third trimester: Secondary | ICD-10-CM

## 2017-06-08 DIAGNOSIS — Z98891 History of uterine scar from previous surgery: Secondary | ICD-10-CM

## 2017-06-08 NOTE — Progress Notes (Signed)
Obstetrics & Gynecology Surgery H&P    Chief Complaint: Scheduled Surgery   History of Present Illness: Patient is a 41 y.o. G1P0 presenting for scheduled repeat cesarean section & BTL, for the treatment or further evaluation of prior cesarean section and undesired fertility.   Clinic [facility]  Dating L=7  Genetic Screen AFP: neg  NIPS: diploid XY  Anatomic [place] complete  GTT Third trimester:   Rhogam 03/21/17  TDaP vaccine 03/21/17            Flu Shot: declined  Baby Food Breast                           Contraception BTL  CS/VBAC CS  +anti-D abs Received rhogam, too week to titer 1st trrimest, repeat second (neg)  h/o 4th degree lac Scheduled c-section with AMS (had CS with G2)   Prenatal Labs  Blood type: --/--/A NEG (08/22 0815)   Antibody:NEG (08/22 0815)  Rubella:  Immune Varicella: Immune  RPR:   NR  HBsAg:   neg  HIV:   neg  GBS: Positive   Review of Systems:10 point review of systems  Past Medical History:  Past Medical History:  Diagnosis Date  . Depression     Past Surgical History:  Past Surgical History:  Procedure Laterality Date  . CESAREAN SECTION  2015  . DILATION AND EVACUATION N/A 08/08/2016   Procedure: DILATATION AND EVACUATION;  Surgeon: [name], MD;  Location: [facility] ORS;  Service: Gynecology;  Laterality: N/A;  . PILONIDAL CYST EXCISION    . VACUUM ASSISTED VAGINAL DELIVERY  2012    Family History:  Family History  Problem Relation Age of Onset  . Heart disease Maternal Grandmother   . Hypertension Paternal Grandmother   . Migraines Paternal Grandmother     Social History:  Social History   Social History  . Marital status: Married    Spouse name: N/A  . Number of children: N/A  . Years of education: N/A   Occupational History  . Not on file.   Social History Main Topics  . Smoking status: Never Smoker  . Smokeless tobacco: Never Used  . Alcohol use Yes     Comment: occassional when not pregnant  . Drug use: No   . Sexual activity: Yes   Other Topics Concern  . Not on file   Social History Narrative  . No narrative on file    Allergies:  Allergies  Allergen Reactions  . Prozac [Fluoxetine Hcl] Itching  . Demerol [Meperidine] Nausea And Vomiting    Medications: Prior to Admission medications   Medication Sig Start Date End Date Taking? Authorizing Provider  citalopram (CELEXA) 20 MG tablet Take 20 mg by mouth every morning.   Yes [provider]  mometasone (ELOCON) 0.1 % ointment Apply 1 application topically daily as needed (dry skin).   Yes [provider]  Prenatal Vit-Fe Fumarate-FA (PRENATAL MULTIVITAMIN) TABS tablet Take 1 tablet by mouth daily with breakfast.    Yes [provider]  PRESCRIPTION MEDICATION Apply 1 application topically daily as needed (dry skin). Triamcinolone mixed with CeraVe   Yes [provider]    Physical Exam Vitals: Blood pressure 124/74, pulse 76, height 5\' 3"  (1.6 m), weight 187 lb (84.8 kg), last menstrual period 09/08/2016. General: NAD HEENT: normocephalic, anicteric Pulmonary: CTAB, No increased work of breathing Cardiovascular: RRR, distal pulses 2+ Abdomen: Gravid, soft, non-tender Extremities: no edema,  erythema, or tenderness Neurologic: Grossly intact Psychiatric: mood appropriate, affect full  Imaging No results found.  Assessment: 41 y.o. G1P0 presenting for scheduled repeat C-section and BTL  Plan: 1) The patient was counseled regarding risk and benefits to proceeding with Cesarean section to expedite delivery.  Risk of cesarean section were discussed including risk of bleeding and need for potential intraoperative or postoperative blood transfusion with a rate of approximately 5% quoted for all Cesarean sections, risk of injury to adjacent organs including but not limited to bowl and bladder, the need for additional surgical procedures to address such injuries, and the risk of infection. Other  reversible forms of contraception were discussed with patient; she declines all other modalities. Permanent nature of as well as associated risks of the procedure discussed with patient including but not limited to: risk of regret, permanence of method, bleeding, infection, injury to surrounding organs and need for additional procedures.  Failure risk of 0.5-1% with increased risk of ectopic gestation if pregnancy occurs was also discussed with patient.     2) Routine postoperative instructions were reviewed with the patient and her family in detail today including the expected length of recovery and likely postoperative course.  The patient concurred with the proposed plan, giving informed written consent for the surgery today.  Patient instructed on the importance of being NPO after midnight prior to her procedure.  If warranted preoperative prophylactic antibiotics and SCDs ordered on call to the OR to meet SCIP guidelines and adhere to recommendation laid forth in ACOG Practice Bulletin Number 104 May 2009  "Antibiotic Prophylaxis for Gynecologic Procedures".

## 2017-06-11 ENCOUNTER — Encounter
Admission: RE | Admit: 2017-06-11 | Discharge: 2017-06-11 | Disposition: A | Discharge status: Home / Self Care | Payer: BLUE CROSS/BLUE SHIELD | Source: Ambulatory Visit | Attending: Obstetrics and Gynecology | Admitting: Obstetrics and Gynecology

## 2017-06-11 DIAGNOSIS — Z0183 Encounter for blood typing: Secondary | ICD-10-CM | POA: Insufficient documentation

## 2017-06-11 DIAGNOSIS — Z23 Encounter for immunization: Secondary | ICD-10-CM | POA: Diagnosis not present

## 2017-06-11 DIAGNOSIS — D62 Acute posthemorrhagic anemia: Secondary | ICD-10-CM | POA: Diagnosis not present

## 2017-06-11 DIAGNOSIS — O34219 Maternal care for unspecified type scar from previous cesarean delivery: Secondary | ICD-10-CM | POA: Diagnosis not present

## 2017-06-11 DIAGNOSIS — Z01812 Encounter for preprocedural laboratory examination: Secondary | ICD-10-CM

## 2017-06-11 DIAGNOSIS — Z302 Encounter for sterilization: Secondary | ICD-10-CM | POA: Diagnosis not present

## 2017-06-11 DIAGNOSIS — Z3A39 39 weeks gestation of pregnancy: Secondary | ICD-10-CM | POA: Diagnosis not present

## 2017-06-11 DIAGNOSIS — O34211 Maternal care for low transverse scar from previous cesarean delivery: Secondary | ICD-10-CM | POA: Diagnosis not present

## 2017-06-11 DIAGNOSIS — O9081 Anemia of the puerperium: Secondary | ICD-10-CM | POA: Diagnosis not present

## 2017-06-11 LAB — CBC
HCT: 30.9 % — ABNORMAL LOW (ref 35.0–47.0)
Hemoglobin: 10.3 g/dL — ABNORMAL LOW (ref 12.0–16.0)
MCH: 28.3 pg (ref 26.0–34.0)
MCHC: 33.5 g/dL (ref 32.0–36.0)
MCV: 84.6 fL (ref 80.0–100.0)
Platelets: 267 10*3/uL (ref 150–440)
RBC: 3.65 MIL/uL — AB (ref 3.80–5.20)
RDW: 14.2 % (ref 11.5–14.5)
WBC: 10.6 10*3/uL (ref 3.6–11.0)

## 2017-06-11 NOTE — Patient Instructions (Signed)
Your procedure is scheduled on: Tuesday, June 12, 2017 [facility]rd floor at 5:30 am; come through the emergency room entrance.   REMEMBER: Instructions that are not followed completely may result in serious medical risk up to and including death; or upon the discretion of your surgeon and anesthesiologist your surgery may need to be rescheduled.  Do not eat food or drink liquids after midnight. No gum chewing or hard candies  No Alcohol for 24 hours before or after surgery.  No Smoking for 24 hours prior to surgery.  Notify your doctor if there is any change in your medical condition (cold, fever, infection).  Do not wear jewelry, make-up, hairpins, clips or nail polish.  Do not wear lotions, powders, or perfumes.   Do not shave 48 hours prior to surgery.   Do not bring valuables to the hospital. [facility] is not responsible for any belongings or valuables.   TAKE THESE MEDICATIONS THE MORNING OF SURGERY WITH A SIP OF WATER:  1.  CELEXA   Use CHG Soap or wipes as directed on instruction sheet; AVOID BREASTS AND NIPPLES.  Stop Anti-inflammatories such as Advil, Aleve, Ibuprofen, Motrin, Naproxen, Naprosyn, Goodie powder, or aspirin products. (May take Tylenol or Acetaminophen and Celebrex if needed.)  Stop supplements until after surgery. (May continue Vitamin D, Vitamin B, and multivitamin.)

## 2017-06-12 ENCOUNTER — Inpatient Hospital Stay: Payer: BLUE CROSS/BLUE SHIELD | Admitting: Certified Registered Nurse Anesthetist

## 2017-06-12 ENCOUNTER — Encounter
Admission: EM | Disposition: A | Discharge status: Home / Self Care | Payer: Self-pay | Source: Home / Self Care | Attending: Obstetrics and Gynecology

## 2017-06-12 ENCOUNTER — Inpatient Hospital Stay
Admission: RE | Admit: 2017-06-12 | Payer: BLUE CROSS/BLUE SHIELD | Source: Ambulatory Visit | Admitting: Obstetrics and Gynecology

## 2017-06-12 ENCOUNTER — Encounter: Payer: Self-pay | Admitting: Obstetrics and Gynecology

## 2017-06-12 ENCOUNTER — Inpatient Hospital Stay
Admission: EM | Admit: 2017-06-12 | Discharge: 2017-06-15 | DRG: 765 | Disposition: A | Discharge status: Home / Self Care | Payer: BLUE CROSS/BLUE SHIELD | Attending: Obstetrics and Gynecology | Admitting: Obstetrics and Gynecology

## 2017-06-12 DIAGNOSIS — Z302 Encounter for sterilization: Secondary | ICD-10-CM | POA: Diagnosis not present

## 2017-06-12 DIAGNOSIS — O34211 Maternal care for low transverse scar from previous cesarean delivery: Secondary | ICD-10-CM | POA: Diagnosis present

## 2017-06-12 DIAGNOSIS — D62 Acute posthemorrhagic anemia: Secondary | ICD-10-CM | POA: Diagnosis not present

## 2017-06-12 DIAGNOSIS — O9081 Anemia of the puerperium: Secondary | ICD-10-CM | POA: Diagnosis not present

## 2017-06-12 DIAGNOSIS — Z98891 History of uterine scar from previous surgery: Secondary | ICD-10-CM

## 2017-06-12 DIAGNOSIS — O34219 Maternal care for unspecified type scar from previous cesarean delivery: Secondary | ICD-10-CM | POA: Diagnosis not present

## 2017-06-12 DIAGNOSIS — Z3A39 39 weeks gestation of pregnancy: Secondary | ICD-10-CM | POA: Diagnosis not present

## 2017-06-12 HISTORY — PX: TUBAL LIGATION: SHX77

## 2017-06-12 LAB — RPR: RPR Ser Ql: NONREACTIVE

## 2017-06-12 SURGERY — Surgical Case
Anesthesia: Spinal | Wound class: Clean Contaminated

## 2017-06-12 MED ORDER — NALOXONE HCL 0.4 MG/ML IJ SOLN: 0.4000 mg | INTRAMUSCULAR | Status: DC | PRN

## 2017-06-12 MED ORDER — ONDANSETRON HCL 4 MG/2ML IJ SOLN
INTRAMUSCULAR | Status: AC
  Filled 2017-06-12: qty 2

## 2017-06-12 MED ORDER — FENTANYL CITRATE (PF) 100 MCG/2ML IJ SOLN: 25.0000 ug | INTRAMUSCULAR | Status: DC | PRN

## 2017-06-12 MED ORDER — OXYCODONE-ACETAMINOPHEN 5-325 MG PO TABS
1.0000 | ORAL_TABLET | ORAL | Status: DC | PRN
  Administered 2017-06-13 – 2017-06-15 (×7): 1 via ORAL
  Filled 2017-06-12 (×7): qty 1

## 2017-06-12 MED ORDER — LACTATED RINGERS IV SOLN: Freq: Once | INTRAVENOUS | Status: DC

## 2017-06-12 MED ORDER — MORPHINE SULFATE (PF) 2 MG/ML IV SOLN
1.0000 mg | INTRAVENOUS | Status: DC | PRN
Start: 2017-06-12 — End: 2017-06-13

## 2017-06-12 MED ORDER — DEXAMETHASONE SODIUM PHOSPHATE 10 MG/ML IJ SOLN
INTRAMUSCULAR | Status: AC
  Filled 2017-06-12: qty 1

## 2017-06-12 MED ORDER — SIMETHICONE 80 MG PO CHEW
80.0000 mg | CHEWABLE_TABLET | ORAL | Status: DC
  Administered 2017-06-13 – 2017-06-15 (×3): 80 mg via ORAL
  Filled 2017-06-12 (×2): qty 1

## 2017-06-12 MED ORDER — OXYTOCIN 40 UNITS IN LACTATED RINGERS INFUSION - SIMPLE MED
INTRAVENOUS | Status: DC | PRN
  Administered 2017-06-12: 300 mL via INTRAVENOUS
  Administered 2017-06-12: 50 mL via INTRAVENOUS
  Administered 2017-06-12: 250 mL via INTRAVENOUS
  Administered 2017-06-12: 200 mL via INTRAVENOUS

## 2017-06-12 MED ORDER — LIDOCAINE HCL (PF) 2 % IJ SOLN
INTRAMUSCULAR | Status: AC
  Filled 2017-06-12: qty 2

## 2017-06-12 MED ORDER — OXYCODONE-ACETAMINOPHEN 5-325 MG PO TABS
2.0000 | ORAL_TABLET | ORAL | Status: DC | PRN
  Filled 2017-06-12: qty 2

## 2017-06-12 MED ORDER — CEFAZOLIN SODIUM-DEXTROSE 2-4 GM/100ML-% IV SOLN
2.0000 g | INTRAVENOUS | Status: AC
  Administered 2017-06-12: 2 g via INTRAVENOUS
  Filled 2017-06-12: qty 100

## 2017-06-12 MED ORDER — OXYTOCIN 40 UNITS IN LACTATED RINGERS INFUSION - SIMPLE MED: 2.5000 [IU]/h | INTRAVENOUS | Status: AC

## 2017-06-12 MED ORDER — SCOPOLAMINE 1 MG/3DAYS TD PT72: 1.0000 | MEDICATED_PATCH | Freq: Once | TRANSDERMAL | Status: DC

## 2017-06-12 MED ORDER — IBUPROFEN 600 MG PO TABS
600.0000 mg | ORAL_TABLET | Freq: Four times a day (QID) | ORAL | Status: DC
  Administered 2017-06-12 – 2017-06-15 (×11): 600 mg via ORAL
  Filled 2017-06-12 (×11): qty 1

## 2017-06-12 MED ORDER — ONDANSETRON HCL 4 MG/2ML IJ SOLN
4.0000 mg | Freq: Three times a day (TID) | INTRAMUSCULAR | Status: DC | PRN
  Filled 2017-06-12: qty 2

## 2017-06-12 MED ORDER — DEXAMETHASONE SODIUM PHOSPHATE 10 MG/ML IJ SOLN
INTRAMUSCULAR | Status: DC | PRN
  Administered 2017-06-12: 10 mg via INTRAVENOUS

## 2017-06-12 MED ORDER — MORPHINE SULFATE (PF) 0.5 MG/ML IJ SOLN
INTRAMUSCULAR | Status: DC | PRN
  Administered 2017-06-12: .1 mg via EPIDURAL

## 2017-06-12 MED ORDER — EPINEPHRINE PF 1 MG/ML IJ SOLN: INTRAMUSCULAR | Status: DC | PRN

## 2017-06-12 MED ORDER — LACTATED RINGERS IV SOLN
INTRAVENOUS | Status: DC
  Administered 2017-06-12: 06:00:00 via INTRAVENOUS

## 2017-06-12 MED ORDER — EPINEPHRINE PF 1 MG/ML IJ SOLN
INTRAMUSCULAR | Status: DC | PRN
  Administered 2017-06-12: .000001 mg via SUBCUTANEOUS

## 2017-06-12 MED ORDER — CITALOPRAM HYDROBROMIDE 20 MG PO TABS
20.0000 mg | ORAL_TABLET | Freq: Every day | ORAL | Status: DC
Start: 2017-06-13 — End: 2017-06-15
  Administered 2017-06-13 – 2017-06-15 (×3): 20 mg via ORAL
  Filled 2017-06-12 (×3): qty 1

## 2017-06-12 MED ORDER — NALOXONE HCL 2 MG/2ML IJ SOSY
1.0000 ug/kg/h | PREFILLED_SYRINGE | INTRAVENOUS | Status: DC | PRN
  Filled 2017-06-12: qty 2

## 2017-06-12 MED ORDER — ONDANSETRON HCL 4 MG/2ML IJ SOLN: 4.0000 mg | Freq: Once | INTRAMUSCULAR | Status: DC | PRN

## 2017-06-12 MED ORDER — NALBUPHINE HCL 10 MG/ML IJ SOLN: 5.0000 mg | INTRAMUSCULAR | Status: DC | PRN

## 2017-06-12 MED ORDER — DIPHENHYDRAMINE HCL 25 MG PO CAPS: 25.0000 mg | ORAL_CAPSULE | Freq: Four times a day (QID) | ORAL | Status: DC | PRN

## 2017-06-12 MED ORDER — PHENYLEPHRINE 40 MCG/ML (10ML) SYRINGE FOR IV PUSH (FOR BLOOD PRESSURE SUPPORT)
PREFILLED_SYRINGE | INTRAVENOUS | Status: DC | PRN
  Administered 2017-06-12 (×5): 100 ug via INTRAVENOUS
  Administered 2017-06-12 (×4): 200 ug via INTRAVENOUS

## 2017-06-12 MED ORDER — COCONUT OIL OIL: 1.0000 "application " | TOPICAL_OIL | Status: DC | PRN

## 2017-06-12 MED ORDER — BUPIVACAINE 0.25 % ON-Q PUMP DUAL CATH 400 ML
400.0000 mL | INJECTION | Status: DC
  Filled 2017-06-12: qty 400

## 2017-06-12 MED ORDER — NALBUPHINE HCL 10 MG/ML IJ SOLN: 5.0000 mg | Freq: Once | INTRAMUSCULAR | Status: DC | PRN

## 2017-06-12 MED ORDER — LACTATED RINGERS IV SOLN
INTRAVENOUS | Status: DC
  Administered 2017-06-12: 14:00:00 via INTRAVENOUS

## 2017-06-12 MED ORDER — MORPHINE SULFATE (PF) 0.5 MG/ML IJ SOLN
INTRAMUSCULAR | Status: AC
  Filled 2017-06-12: qty 10

## 2017-06-12 MED ORDER — SENNOSIDES-DOCUSATE SODIUM 8.6-50 MG PO TABS: 2.0000 | ORAL_TABLET | ORAL | Status: DC

## 2017-06-12 MED ORDER — DIPHENHYDRAMINE HCL 50 MG/ML IJ SOLN
12.5000 mg | INTRAMUSCULAR | Status: DC | PRN
  Filled 2017-06-12: qty 0.25

## 2017-06-12 MED ORDER — BUPIVACAINE HCL 0.5 % IJ SOLN
50.0000 mL | Freq: Once | INTRAMUSCULAR | Status: DC
  Filled 2017-06-12: qty 50

## 2017-06-12 MED ORDER — DIPHENHYDRAMINE HCL 25 MG PO CAPS
25.0000 mg | ORAL_CAPSULE | ORAL | Status: DC | PRN
  Filled 2017-06-12: qty 1

## 2017-06-12 MED ORDER — SOD CITRATE-CITRIC ACID 500-334 MG/5ML PO SOLN
30.0000 mL | ORAL | Status: AC
  Administered 2017-06-12: 30 mL via ORAL
  Filled 2017-06-12: qty 30

## 2017-06-12 MED ORDER — SODIUM CHLORIDE 0.9% FLUSH: 3.0000 mL | INTRAVENOUS | Status: DC | PRN

## 2017-06-12 MED ORDER — SIMETHICONE 80 MG PO CHEW
80.0000 mg | CHEWABLE_TABLET | ORAL | Status: DC | PRN
  Administered 2017-06-13 – 2017-06-14 (×2): 80 mg via ORAL
  Filled 2017-06-12 (×3): qty 1

## 2017-06-12 MED ORDER — MEPERIDINE HCL 25 MG/ML IJ SOLN: 6.2500 mg | INTRAMUSCULAR | Status: DC | PRN

## 2017-06-12 MED ORDER — MENTHOL 3 MG MT LOZG
1.0000 | LOZENGE | OROMUCOSAL | Status: DC | PRN
  Filled 2017-06-12: qty 9

## 2017-06-12 MED ORDER — OXYTOCIN 40 UNITS IN LACTATED RINGERS INFUSION - SIMPLE MED
INTRAVENOUS | Status: AC
  Filled 2017-06-12: qty 1000

## 2017-06-12 MED ORDER — PRENATAL MULTIVITAMIN CH
1.0000 | ORAL_TABLET | Freq: Every day | ORAL | Status: DC
  Filled 2017-06-12 (×4): qty 1

## 2017-06-12 MED ORDER — IBUPROFEN 600 MG PO TABS
600.0000 mg | ORAL_TABLET | Freq: Four times a day (QID) | ORAL | Status: DC | PRN
  Administered 2017-06-12 (×2): 600 mg via ORAL
  Filled 2017-06-12 (×3): qty 1

## 2017-06-12 MED ORDER — ACETAMINOPHEN 325 MG PO TABS
650.0000 mg | ORAL_TABLET | ORAL | Status: DC | PRN
  Filled 2017-06-12: qty 2

## 2017-06-12 MED ORDER — WITCH HAZEL-GLYCERIN EX PADS: 1.0000 "application " | MEDICATED_PAD | CUTANEOUS | Status: DC | PRN

## 2017-06-12 MED ORDER — BUPIVACAINE IN DEXTROSE 0.75-8.25 % IT SOLN
INTRATHECAL | Status: DC | PRN
  Administered 2017-06-12: 1.7 mL via INTRATHECAL

## 2017-06-12 MED ORDER — SENNOSIDES-DOCUSATE SODIUM 8.6-50 MG PO TABS
2.0000 | ORAL_TABLET | ORAL | Status: DC
  Administered 2017-06-13 – 2017-06-15 (×3): 2 via ORAL
  Filled 2017-06-12 (×3): qty 2

## 2017-06-12 MED ORDER — DIBUCAINE 1 % RE OINT: 1.0000 "application " | TOPICAL_OINTMENT | RECTAL | Status: DC | PRN

## 2017-06-12 MED ORDER — ONDANSETRON HCL 4 MG/2ML IJ SOLN
INTRAMUSCULAR | Status: DC | PRN
  Administered 2017-06-12: 4 mg via INTRAVENOUS

## 2017-06-12 MED ORDER — BUPIVACAINE HCL (PF) 0.5 % IJ SOLN
INTRAMUSCULAR | Status: DC | PRN
  Administered 2017-06-12: 10 mL

## 2017-06-12 MED ORDER — SIMETHICONE 80 MG PO CHEW
80.0000 mg | CHEWABLE_TABLET | Freq: Three times a day (TID) | ORAL | Status: DC
  Administered 2017-06-12 – 2017-06-15 (×7): 80 mg via ORAL
  Filled 2017-06-12 (×10): qty 1

## 2017-06-12 SURGICAL SUPPLY — 29 items
BAG COUNTER SPONGE EZ (MISCELLANEOUS) ×3 IMPLANT
CANISTER SUCT 3000ML PPV (MISCELLANEOUS) ×3 IMPLANT
CATH KIT ON-Q SILVERSOAK 5IN (CATHETERS) ×6 IMPLANT
CHLORAPREP W/TINT 26ML (MISCELLANEOUS) ×6 IMPLANT
DERMABOND ADVANCED (GAUZE/BANDAGES/DRESSINGS) ×1
DERMABOND ADVANCED .7 DNX12 (GAUZE/BANDAGES/DRESSINGS) ×2 IMPLANT
DRSG OPSITE POSTOP 4X10 (GAUZE/BANDAGES/DRESSINGS) ×3 IMPLANT
DRSG TELFA 3X8 NADH (GAUZE/BANDAGES/DRESSINGS) ×3 IMPLANT
ELECT CAUTERY BLADE 6.4 (BLADE) IMPLANT
ELECT REM PT RETURN 9FT ADLT (ELECTROSURGICAL) ×3
ELECTRODE REM PT RTRN 9FT ADLT (ELECTROSURGICAL) ×2 IMPLANT
GAUZE SPONGE 4X4 12PLY STRL (GAUZE/BANDAGES/DRESSINGS) ×3 IMPLANT
GLOVE BIO SURGEON STRL SZ7 (GLOVE) ×12 IMPLANT
GLOVE INDICATOR 7.5 STRL GRN (GLOVE) ×12 IMPLANT
GOWN STRL REUS W/ TWL LRG LVL3 (GOWN DISPOSABLE) ×6 IMPLANT
GOWN STRL REUS W/TWL LRG LVL3 (GOWN DISPOSABLE) ×3
HANDLE YANKAUER SUCT BULB TIP (MISCELLANEOUS) ×3 IMPLANT
NS IRRIG 1000ML POUR BTL (IV SOLUTION) ×3 IMPLANT
PACK C SECTION AR (MISCELLANEOUS) ×3 IMPLANT
PAD OB MATERNITY 4.3X12.25 (PERSONAL CARE ITEMS) ×3 IMPLANT
PAD PREP 24X41 OB/GYN DISP (PERSONAL CARE ITEMS) ×3 IMPLANT
SPONGE LAP 18X18 5 PK (GAUZE/BANDAGES/DRESSINGS) ×3 IMPLANT
STRIP CLOSURE SKIN 1/2X4 (GAUZE/BANDAGES/DRESSINGS) ×3 IMPLANT
SUT 2-0 PL GUT LIGAPAK (SUTURE) ×3 IMPLANT
SUT MNCRL AB 4-0 PS2 18 (SUTURE) ×3 IMPLANT
SUT PDS AB 1 TP1 96 (SUTURE) ×3 IMPLANT
SUT VIC AB 0 CTX 36 (SUTURE) ×2
SUT VIC AB 0 CTX36XBRD ANBCTRL (SUTURE) ×4 IMPLANT
SUT VIC AB 2-0 CT1 36 (SUTURE) IMPLANT

## 2017-06-12 NOTE — Anesthesia Preprocedure Evaluation (Signed)
Anesthesia Evaluation  Patient identified by MRN, date of birth, ID band Patient awake    Reviewed: Allergy & Precautions, NPO status , Patient's Chart, lab work & pertinent test results  History of Anesthesia Complications Negative for: history of anesthetic complications  Airway Mallampati: II  TM Distance: >3 FB Neck ROM: Full    Dental no notable dental hx.    Pulmonary neg pulmonary ROS, neg sleep apnea,    breath sounds clear to auscultation- rhonchi (-) decreased breath sounds(-) wheezing      Cardiovascular Exercise Tolerance: Good (-) hypertension(-) CAD and (-) Past MI negative cardio ROS   Rhythm:Regular Rate:Normal - Systolic murmurs and - Diastolic murmurs    Neuro/Psych Depression negative neurological ROS  negative psych ROS   GI/Hepatic negative GI ROS, Neg liver ROS,   Endo/Other  negative endocrine ROSneg diabetes  Renal/GU negative Renal ROS  negative genitourinary   Musculoskeletal negative musculoskeletal ROS (+)   Abdominal (+) - obese,   Peds negative pediatric ROS (+)  Hematology negative hematology ROS (+)   Anesthesia Other Findings   Reproductive/Obstetrics (+) Pregnancy                             Anesthesia Physical  Anesthesia Plan  ASA: II  Anesthesia Plan: Spinal   Post-op Pain Management:    Induction: Intravenous  PONV Risk Score and Plan:   Airway Management Planned: Nasal Cannula  Additional Equipment:   Intra-op Plan:   Post-operative Plan:   Informed Consent: I have reviewed the patients History and Physical, chart, labs and discussed the procedure including the risks, benefits and alternatives for the proposed anesthesia with the patient or authorized representative who has indicated his/her understanding and acceptance.   Dental advisory given  Plan Discussed with: CRNA and Anesthesiologist  Anesthesia Plan Comments:          Lab Results  Component Value Date   WBC 10.6 06/11/2017   HGB 10.3 (L) 06/11/2017   HCT 30.9 (L) 06/11/2017   MCV 84.6 06/11/2017   PLT 267 06/11/2017    Anesthesia Quick Evaluation

## 2017-06-12 NOTE — H&P (Signed)
Date of Initial H&P: 06/08/17  History reviewed, patient examined, no change in status, stable for surgery.

## 2017-06-12 NOTE — Discharge Summary (Signed)
OB Discharge Summary     Patient Name: Adrienne Richards DOB: 1976/03/20 MRN: [id number]  Date of admission: 06/12/2017 Delivering MD: [name]   Date of discharge: 06/15/2017  Admitting diagnosis: Scheduled Induction  PREVIOUS CESAREAN,DESIRES REPEAT Intrauterine pregnancy: [redacted]w[redacted]d     Secondary diagnosis:  Active Problems:   Indication for care or intervention in labor or delivery   S/P cesarean section  Additional problems: none     Discharge diagnosis: Term Pregnancy Delivered                                                                                                Post partum procedures:rhogam  Augmentation: N/A  Complications: None  Hospital course:  Sceduled C/S   41 y.o. yo [id number] at [redacted]w[redacted]d was admitted to the hospital 06/12/2017 for scheduled cesarean section with the following indication:Elective Repeat and and bilateral tubal ligation.  Patient delivered a Viable infant.06/12/2017  Details of operation can be found in separate operative note.  Pateint had an uncomplicated postpartum course.  She is ambulating, tolerating a regular diet, passing flatus, and urinating well. Patient is discharged home in stable condition on  06/15/17         Physical exam  Vitals:   06/14/17 1912 06/15/17 0000 06/15/17 0500 06/15/17 0740  BP: (!) 146/74   124/64  Pulse: 77   79  Resp: 18   17  Temp: 98.4 F (36.9 C) 98.4 F (36.9 C) 97.6 F (36.4 C) 97.6 F (36.4 C)  TempSrc: Oral Oral Oral Oral  SpO2: 98%   99%  Weight:      Height:       General: alert and no distress Lochia: appropriate Uterine Fundus: firm Incision: Dressing is clean, dry, and intact DVT Evaluation: No evidence of DVT seen on physical exam. Labs: Lab Results  Component Value Date   WBC 15.2 (H) 06/13/2017   HGB 8.5 (L) 06/13/2017   HCT 25.3 (L) 06/13/2017   MCV 85.2 06/13/2017   PLT 242 06/13/2017   CMP Latest Ref Rng & Units 08/03/2016  Glucose 65 - 99 mg/dL 78  BUN 6 - 20 mg/dL 10   Creatinine 3.08 - 1.00 mg/dL 6.57  Sodium 846 - 962 mmol/L 137  Potassium 3.5 - 5.1 mmol/L 3.9  Chloride 101 - 111 mmol/L 104  CO2 22 - 32 mmol/L 25  Calcium 8.9 - 10.3 mg/dL 9.0  Total Protein 6.5 - 8.1 g/dL 7.2  Total Bilirubin 0.3 - 1.2 mg/dL 0.6  Alkaline Phos 38 - 126 U/L 35(L)  AST 15 - 41 U/L 21  ALT 14 - 54 U/L 9(L)   Information for the patient's newborn:  Deundra, Furber [[id number]]  A POS   Discharge instruction: per After Visit Summary and "Baby and Me Booklet".  After visit meds:   Allergies as of 06/15/2017      Reactions   Prozac [fluoxetine Hcl] Itching   Demerol [meperidine] Nausea And Vomiting      Medication List    TAKE these medications   citalopram 20 MG tablet Commonly known as:  CELEXA  Take 20 mg by mouth every morning.   Ferrous Fumarate 324 (106 Fe) MG Tabs tablet Commonly known as:  HEMOCYTE - 106 mg FE Take 1 tablet (106 mg of iron total) by mouth daily.   ibuprofen 600 MG tablet Commonly known as:  ADVIL,MOTRIN Take 1 tablet (600 mg total) by mouth every 6 (six) hours.   mometasone 0.1 % ointment Commonly known as:  ELOCON Apply 1 application topically daily as needed (dry skin).   oxyCODONE-acetaminophen 5-325 MG tablet Commonly known as:  PERCOCET/ROXICET Take 1-2 tablets by mouth every 4 (four) hours as needed (pain scale > 7).   prenatal multivitamin Tabs tablet Take 1 tablet by mouth daily with breakfast.   PRESCRIPTION MEDICATION Apply 1 application topically daily as needed (dry skin). Triamcinolone mixed with CeraVe       Diet: routine diet  Activity: Advance as tolerated. Pelvic rest for 6 weeks.   Outpatient follow up:1 week incision check Follow up Appt: Future Appointments Date Time Provider Department Center  06/19/2017 8:00 AM Nadara MustardHarris, Robert P, MD [facility] None   Follow up Visit:No Follow-up on file.  Postpartum contraception: s/p BTL  Newborn Data: Live born female  Birth Weight: 7 lb 11.5 oz (3500  g) Richards: 9, 9  Baby Feeding: Breast Disposition:home with mother   06/15/2017 Vena AustriaAndreas Melesa Lecy, MD

## 2017-06-12 NOTE — Transfer of Care (Signed)
Immediate Anesthesia Transfer of Care Note  Patient: Adrienne Richards  Procedure(s) Performed: Procedure(s): CESAREAN SECTION WITH BILATERAL TUBAL LIGATION  Patient Location: PACU  Anesthesia Type:Spinal  Level of Consciousness: awake, alert  and oriented  Airway & Oxygen Therapy: Patient Spontanous Breathing  Post-op Assessment: Report given to RN and Post -op Vital signs reviewed and stable  Post vital signs: Reviewed and stable  Last Vitals:  Vitals:   06/12/17 0543 06/12/17 0924  BP: 138/77 100/72  Pulse: 83 82  Resp: 16 10  Temp: 36.7 C 36.3 C    Last Pain:  Vitals:   06/12/17 0924  TempSrc: Oral         Complications: No apparent anesthesia complications

## 2017-06-12 NOTE — Op Note (Signed)
Preoperative Diagnosis: 1) 41 y.o. [id number] at 5035w4d 2) Desires permanent surgical sterilization 3)History of prior cesarean section  Postoperative Diagnosis: 1) [age] y.o. [id number] at 4135w4d 2) Desires permanent surgical sterilization 3) History of prior cesarean section  Operation Performed: Repeat low transverse C-section via pfannenstiel skin incision and Pomeroy tubal ligation  Anesthesia: Spinal  Primary Surgeon: Vena AustriaAndreas Hiroshi Krummel, MD  Assistant: Velora Mediateobert Harris, MD  Preoperative Antibiotics: 2g ancef  Estimated Blood Loss:64000mL  IV Fluids: 1200mL  Urine Output:: 30mL  Drains or Tubes: Foley to gravity drainage, ON-Q catheter system  Implants: none  Specimens Removed: none  Complications: none  Intraoperative Findings:  Normal tubes ovaries and uterus.  Delivery resulted in the birth of a liveborn female, APGAR (1 MIN): 9  APGAR (5 MINS): 9, weight 7lbs 11oz  Patient Condition: stable  Procedure in Detail:  Patient was taken to the operating room were she was administered regional anesthesia.  She was positioned in the supine position, prepped and draped in the  Usual sterile fashion.  Prior to proceeding with the case a time out was performed and the level of anesthetic was checked and noted to be adequate.  Utilizing the scalpel a pfannenstiel skin incision was made 2cm above the pubic symphysis utilizing the patient's pre-existing scar and carried down sharply to the the level of the rectus fascia.  The fascia was incised in the midline using the scalpel and then extended using mayo scissors.  The superior border of the rectus fascia was grasped with two Kocher clamps and the underlying rectus muscles were dissected of the fascia using blunt dissection.  The median raphae was incised using Mayo scissors.   The inferior border of the rectus fascia was dissected of the rectus muscles in a similar fashion.  The midline was identified, the peritoneum was entered bluntly and expanded  using manual tractions.  The uterus was noted to be in a none rotated position.  Next the bladder blade was placed retracting the bladder caudally.  A bladder flap was created.  The bladder reflection was grasped with a pickup, and Metzenbaum scissors were then used the undermine the bladder reflection.  The bladder flap was developed using digital dissection.  The bladder blade was replaced retracting the bladder caudally out of the operative field.  A low transverse incision was scored on the lower uterine segment.  The hysterotomy was entered bluntly using the operators finger.  The hysterotomy incision was extended using manual traction.  The operators hand was placed within the hysterotomy position noting the fetus to be within the OA position.  The vertex was grasped, flexed, brought to the incision, and delivered a traumatically using fundal pressure.  The remainder of the body delivered with ease.  The infant was suctioned, cord was clamped and cut before handing off to the awaiting neonatologist.  The placenta was delivered using manual extraction.  The uterus was exteriorized, wiped clean of clots and debris using two moist laps.  The hysterotomy was closed using a two layer closure of 0 Vicryl, with the first being a running locked, the second a vertical imbricating.  The right tube was grasped in a mid isthmic portion using a Babcock clamp, before being double suture ligated using a 0 chromic wheel.  The intervening nuckel of tube was excised using Metzenbaum scissors.  Complete cross section of tubal ostia visualized, and noted to be hemostatic  This procedure was then repeated in similar fashion for the patient left tube.    The  uterus was returned to the abdomen.  The peritoneal gutters were wiped clean of clots and debris using two moist laps.  The hysterotomy incision and tubal pedicles were re-inspected noted to be hemostatic.  The rectus muscles were re-approximated in the midline using a  single 2-0 Vicryl mattress stitch.  The rectus muscles were inspected noted to be hemostatic.  The superior border of the rectus fascia was grasped with a Kocher clamp.  The ON-Q trocars were then placed 4cm above the superior border of the incision and tunneled subfascially.  The introducers were removed and the catheters were threaded through the sleeves after which the sleeves were removed.  The fascia was closed using a looped #1 PDS in a running fashion taking 1cm by 1cm bites.  The subcutaneous tissue was irrigated using warm saline, hemostasis achieved using the bovie.  The subcutaneous dead space was less 3cm and was not closed.  The skin was closed using 4-0 Monocryl in a subcuticular fashion.  Sponge needle and instrument counts were corrects times two.  The patient tolerated the procedure well and was taken to the recovery room in stable condition.

## 2017-06-12 NOTE — Anesthesia Procedure Notes (Signed)
Date/Time: 06/12/2017 7:46 AM Performed by: Ginger CarneMICHELET, Cherylin Waguespack Pre-anesthesia Checklist: Patient identified, Emergency Drugs available, Suction available, Patient being monitored and Timeout performed Patient Re-evaluated:Patient Re-evaluated prior to inductionOxygen Delivery Method: Nasal cannula

## 2017-06-12 NOTE — Anesthesia Post-op Follow-up Note (Cosign Needed)
Anesthesia QCDR form completed.        

## 2017-06-12 NOTE — Anesthesia Procedure Notes (Signed)
Spinal  Patient location during procedure: OR Start time: 06/12/2017 7:55 AM End time: 06/12/2017 8:10 AM Staffing Anesthesiologist: Alvin Critchley Resident/CRNA: Johnna Acosta Performed: anesthesiologist  Preanesthetic Checklist Completed: patient identified, site marked, surgical consent, pre-op evaluation, timeout performed, IV checked, risks and benefits discussed and monitors and equipment checked Spinal Block Patient position: sitting Prep: Betadine Patient monitoring: heart rate, continuous pulse ox, blood pressure and cardiac monitor Approach: midline Location: L4-5 Injection technique: single-shot Needle Needle type: Whitacre and Introducer  Needle gauge: 25 G Needle length: 9 cm Assessment Sensory level: T4 Additional Notes Negative paresthesia. Negative blood return. Positive free-flowing CSF. Expiration date of kit checked and confirmed. Patient tolerated procedure well, without complications.  Multiple attempts

## 2017-06-13 ENCOUNTER — Encounter: Payer: Self-pay | Admitting: Obstetrics and Gynecology

## 2017-06-13 LAB — TYPE AND SCREEN
ABO/RH(D): A NEG
Antibody Screen: POSITIVE
Extend sample reason: UNDETERMINED
UNIT DIVISION: 0
Unit division: 0

## 2017-06-13 LAB — BPAM RBC
Blood Product Expiration Date: 201807192359
Blood Product Expiration Date: 201807192359
UNIT TYPE AND RH: 600
UNIT TYPE AND RH: 600

## 2017-06-13 LAB — CBC
HEMATOCRIT: 25.3 % — AB (ref 35.0–47.0)
Hemoglobin: 8.5 g/dL — ABNORMAL LOW (ref 12.0–16.0)
MCH: 28.5 pg (ref 26.0–34.0)
MCHC: 33.5 g/dL (ref 32.0–36.0)
MCV: 85.2 fL (ref 80.0–100.0)
Platelets: 242 10*3/uL (ref 150–440)
RBC: 2.97 MIL/uL — ABNORMAL LOW (ref 3.80–5.20)
RDW: 14.4 % (ref 11.5–14.5)
WBC: 15.2 10*3/uL — ABNORMAL HIGH (ref 3.6–11.0)

## 2017-06-13 LAB — SURGICAL PATHOLOGY

## 2017-06-13 LAB — FETAL SCREEN: Fetal Screen: NEGATIVE

## 2017-06-13 MED ORDER — RHO D IMMUNE GLOBULIN 1500 UNIT/2ML IJ SOSY
300.0000 ug | PREFILLED_SYRINGE | Freq: Once | INTRAMUSCULAR | Status: AC
  Administered 2017-06-13: 300 ug via INTRAVENOUS
  Filled 2017-06-13: qty 2

## 2017-06-13 NOTE — Anesthesia Postprocedure Evaluation (Signed)
Anesthesia Post Note  Patient: Donne HazelRobin J Stamas  Procedure(s) Performed: Procedure(s): CESAREAN SECTION WITH BILATERAL TUBAL LIGATION  Patient location during evaluation: Mother Baby Anesthesia Type: Spinal Level of consciousness: oriented and awake and alert Pain management: pain level controlled Vital Signs Assessment: post-procedure vital signs reviewed and stable Respiratory status: spontaneous breathing, respiratory function stable and patient connected to nasal cannula oxygen Cardiovascular status: blood pressure returned to baseline and stable Postop Assessment: no headache and no backache Anesthetic complications: no     Last Vitals:  Vitals:   06/13/17 0309 06/13/17 0811  BP: (!) 102/49 (!) 110/56  Pulse: 64 71  Resp: 18 20  Temp: 36.9 C 36.4 C    Last Pain:  Vitals:   06/13/17 0813  TempSrc:   PainSc: 1                  Starling Mannsurtis,  Nevyn Bossman A

## 2017-06-13 NOTE — Anesthesia Post-op Follow-up Note (Signed)
  Anesthesia Pain Follow-up Note  Patient: Adrienne Richards  Day #: 1  Date of Follow-up: 06/13/2017 Time: 8:57 AM  Last Vitals:  Vitals:   06/13/17 0309 06/13/17 0811  BP: (!) 102/49 (!) 110/56  Pulse: 64 71  Resp: 18 20  Temp: 36.9 C 36.4 C    Level of Consciousness: alert  Pain: none   Side Effects:None  Catheter Site Exam:clean, dry, no drainage     Plan: D/C from anesthesia care at surgeon's request  Starling MannsCurtis,  Nester Bachus A

## 2017-06-13 NOTE — Progress Notes (Signed)
Patient ID: Adrienne Richards, female   DOB: 06/07/1976, 41 y.o.   MRN: [id number]  Obstetric Postpartum/PostOperative Daily Progress Note Subjective:  41 y.o. [id number] status post repeat cesarean delivery.  She is ambulating, is tolerating po, is voiding spontaneously.  Her pain is well controlled on PO pain medications. Her lochia is less than menses.   Medications SCHEDULED MEDICATIONS  . citalopram  20 mg Oral Daily  . ibuprofen  600 mg Oral Q6H  . prenatal multivitamin  1 tablet Oral Q1200  . scopolamine  1 patch Transdermal Once  . senna-docusate  2 tablet Oral Q24H  . simethicone  80 mg Oral TID PC  . simethicone  80 mg Oral Q24H    MEDICATION INFUSIONS  . bupivacaine 0.25 % ON-Q pump DUAL CATH 400 mL    . lactated ringers 125 mL/hr at 06/12/17 2300    PRN MEDICATIONS  acetaminophen, coconut oil, witch hazel-glycerin **AND** dibucaine, diphenhydrAMINE **OR** diphenhydrAMINE, menthol-cetylpyridinium, ondansetron (ZOFRAN) IV, oxyCODONE-acetaminophen, oxyCODONE-acetaminophen, simethicone, [DISCONTINUED] naloxone **AND** sodium chloride flush    Objective:   Vitals:   06/12/17 2304 06/13/17 0309 06/13/17 0811 06/13/17 1227  BP: (!) 120/55 (!) 102/49 (!) 110/56   Pulse: 70 64 71   Resp: 18 18 20    Temp: 98.5 F (36.9 C) 98.4 F (36.9 C) 97.6 F (36.4 C) 98.6 F (37 C)  TempSrc: Oral Oral Oral Oral  SpO2: 99% 99% 98%   Weight:      Height:        Current Vital Signs 24h Vital Sign Ranges  T 98.6 F (37 C) Temp  Avg: 98.3 F (36.8 C)  Min: 97.6 F (36.4 C)  Max: 98.7 F (37.1 C)  BP (!) 110/56 BP  Min: 102/49  Max: 136/71  HR 71 Pulse  Avg: 72  Min: 64  Max: 77  RR 20 Resp  Avg: 18.3  Min: 18  Max: 20  SaO2 98 % Not Delivered SpO2  Avg: 98.7 %  Min: 98 %  Max: 100 %       24 Hour I/O Current Shift I/O  Time Ins Outs 06/26 0701 - 06/27 0700 In: 2835 [P.O.:240; I.V.:2595] Out: 5130 [Urine:4530] 06/27 0701 - 06/27 1900 In: 240 [P.O.:240] Out: 300 [Urine:300]  General:  NAD Pulmonary: no increased work of breathing Abdomen: non-distended, non-tender, fundus firm at level of umbilicus Inc: Clean/dry/intact, OnQ pump in place with no leakage Extremities: no edema, no erythema, no tenderness  Labs:   Recent Labs Lab 06/11/17 1014 06/13/17 0609  WBC 10.6 15.2*  HGB 10.3* 8.5*  HCT 30.9* 25.3*  PLT 267 242     Assessment:   41 y.o. [id number] postoperative day # 1, s/p repeat cesarean section, BTL, doing well  Plan:  1) Acute blood loss anemia - hemodynamically stable and asymptomatic - po ferrous sulfate  2) A NEG / Rubella  / Varicella Immune  3) TDAP status up to date  4) breast /Contraception = bilateral tubal ligation  5) Disposition: home POD#2-3  Conard NovakStephen D. Henson Fraticelli, MD 06/13/2017 1:28 PM

## 2017-06-14 LAB — RHOGAM INJECTION: Unit division: 0

## 2017-06-14 MED ORDER — FERROUS FUMARATE 324 (106 FE) MG PO TABS
1.0000 | ORAL_TABLET | Freq: Every day | ORAL | Status: DC
  Administered 2017-06-14 – 2017-06-15 (×2): 106 mg via ORAL
  Filled 2017-06-14 (×2): qty 1

## 2017-06-14 NOTE — Progress Notes (Signed)
POD #2 repeat CS and BTL Subjective:   Tolerating regular diet. Passing flatus. Voiding without difficulty. Baby Adrienne Richards breast feeding well. Had some burning around incision with shower this Am. No lightheadedness.  Objective:  Blood pressure (!) 132/58, pulse 85, temperature 98.1 F (36.7 C), temperature source Oral, resp. rate 20, height 5\' 3"  (1.6 m), weight 80.7 kg (178 lb), last menstrual period 09/08/2016, SpO2 96 %, unknown if currently breastfeeding.  General: NAD Heart: RRR without murmur Pulmonary: no increased work of breathing/ CTAB Abdomen: non-distended, non-tender, fundus firm at level of umbilicus-4FB, BS active Incision: redness/irritation at edges of Tegaderm bilaterally. Tegaderm and honeycomb dressing removed. Incision C+D+I. Telfa applied over incision with no tape. ON Q intact Extremities: no edema, no erythema, no tenderness  Results for orders placed or performed during the hospital encounter of 06/12/17 (from the past 72 hour(s))  Surgical pathology     Status: None   Collection Time: 06/12/17  7:57 AM  Result Value Ref Range   SURGICAL PATHOLOGY      Surgical Pathology CASE: [id number] PATIENT: Adrienne Richards Surgical Pathology Report     SPECIMEN SUBMITTED: A. Tube segment, right B. Tube segment, left  CLINICAL HISTORY: None provided  PRE-OPERATIVE DIAGNOSIS: Previous Cesarean, desires repeat  POST-OPERATIVE DIAGNOSIS: Same as pre-op     DIAGNOSIS: A. FALLOPIAN TUBE, RIGHT; PARTIAL SALPINGECTOMY FOR STERILIZATION: - NO PATHOLOGIC CHANGES; COMPLETE CROSS SECTION OF LUMEN PRESENT.  B. FALLOPIAN TUBE, LEFT; PARTIAL SALPINGECTOMY FOR STERILIZATION: - NO PATHOLOGIC CHANGES; COMPLETE CROSS SECTION OF LUMEN PRESENT.   GROSS DESCRIPTION: A. Labeled: right tube segment Size: 1.9 x 1.0 x 0.5 cm Other findings: pink tubular fragment  Block summary: 1- representative cross-sections   B. Labeled: left tube segment Size: 1.5 x 0.8 x 0.5  cm Other findings: tubular purple fragment  Block summary: 1- representative cross-sections  Final Diagnosis performed by Ronald Lobo, MD.  Electronically signed  06/13/2017 6:55:45PM    The electronic signature indicates that the named Attending Pathologist has evaluated the specimen  Technical component performed at [facility] [address] [place], [place] [address] Lab: [phone] Dir: Titus Dubin. Cato Mulligan, MD  Professional component performed at [facility], [address] [place], [place], [place] [address] Lab: [phone] Dir: Georgiann Cocker. Rubinas, MD    Fetal screen     Status: None   Collection Time: 06/13/17  6:09 AM  Result Value Ref Range   Fetal Screen NEG   Rhogam injection     Status: None   Collection Time: 06/13/17  6:09 AM  Result Value Ref Range   Unit Number [id number]    Blood Component Type RHIG    Unit division 00    Status of Unit ISSUED,FINAL    Transfusion Status OK TO TRANSFUSE   CBC     Status: Abnormal   Collection Time: 06/13/17  6:09 AM  Result Value Ref Range   WBC 15.2 (H) 3.6 - 11.0 K/uL   RBC 2.97 (L) 3.80 - 5.20 MIL/uL   Hemoglobin 8.5 (L) 12.0 - 16.0 g/dL   HCT 25.3 (L) 66.4 - 40.3 %   MCV 85.2 80.0 - 100.0 fL   MCH 28.5 26.0 - 34.0 pg   MCHC 33.5 32.0 - 36.0 g/dL   RDW 47.4 25.9 - 56.3 %   Platelets 242 150 - 440 K/uL   [name] positive. Patient received Rhogam yesterday  Assessment:   41 y.o. [id number] postoperativeday # 2-stable. Continue postoperative care Irritation from Tegaderm   Plan:  1) Acute blood loss anemia - hemodynamically stable and asymptomatic - po iron and vitamins.   2)A neg and [name] pos- received Rhogam 6/27/ RI/ VI  3) TDAP UTD  4) Breast/BTL  5) Disposition-discharge tomorrow  Farrel Connersolleen Garison Genova, CNM

## 2017-06-15 MED ORDER — FERROUS FUMARATE 324 (106 FE) MG PO TABS: 1.0000 | ORAL_TABLET | Freq: Every day | ORAL | 1 refills | Status: DC

## 2017-06-15 MED ORDER — IBUPROFEN 600 MG PO TABS: 600.0000 mg | ORAL_TABLET | Freq: Four times a day (QID) | ORAL | 0 refills | Status: DC

## 2017-06-15 MED ORDER — OXYCODONE-ACETAMINOPHEN 5-325 MG PO TABS: 1.0000 | ORAL_TABLET | ORAL | 0 refills | Status: DC | PRN

## 2017-06-15 NOTE — Progress Notes (Signed)
Reviewed all patients discharge instructions and handouts regarding postpartum bleeding, no intercourse for 6 weeks, signs and symptoms of mastitis and postpartum bleu's. Provided patient instructions on how to properly care for incision Reviewed discharge instructions for newborn regarding proper cord care, how and when to bathe the newborn, nail care, proper way to take the baby's temperature, along with safe sleep. All questions have been answered at this time. Patient discharged via wheelchair with axillary.

## 2017-06-19 ENCOUNTER — Ambulatory Visit (INDEPENDENT_AMBULATORY_CARE_PROVIDER_SITE_OTHER): Payer: BLUE CROSS/BLUE SHIELD | Admitting: Obstetrics & Gynecology

## 2017-06-19 VITALS — BP 120/80 | Ht 63.0 in | Wt 157.0 lb

## 2017-06-19 DIAGNOSIS — Z98891 History of uterine scar from previous surgery: Secondary | ICD-10-CM

## 2017-06-19 NOTE — Progress Notes (Signed)
  Postoperative Follow-up Patient presents post op from CS BTL for Term/repeat/sterility, 1 week ago.  Subjective: Patient reports marked improvement in her preop symptoms. Eating a regular diet without difficulty. Pain is controlled with current analgesics. Medications being used: ibuprofen (OTC) and narcotic analgesics including Percocet.  Activity: physically active around house. Patient reports vaginal sx's of appropriate lochia; incisional abd pain mild.  Objective: LMP 09/08/2016 Comment: missed ab Physical Exam  Constitutional: She is oriented to person, place, and time. She appears well-developed and well-nourished. No distress.  Cardiovascular: Normal rate.   Pulmonary/Chest: Effort normal.  Abdominal: Soft. She exhibits no distension. There is no tenderness.  Incision Healing Well   Musculoskeletal: Normal range of motion.  Neurological: She is alert and oriented to person, place, and time. No cranial nerve deficit.  Skin: Skin is warm and dry.  Psychiatric: She has a normal mood and affect.    Assessment: s/p :  cesarean section and tubal ligation stable  Plan: Patient has done well after surgery with no apparent complications.  I have discussed the post-operative course to date, and the expected progress moving forward.  The patient understands what complications to be concerned about.  I will see the patient in routine follow up, or sooner if needed.    Activity plan: No heavy lifting.Pelvic rest.  Letitia Libraobert Paul Deago Burruss 06/19/2017, 8:03 AM

## 2017-07-25 ENCOUNTER — Ambulatory Visit (INDEPENDENT_AMBULATORY_CARE_PROVIDER_SITE_OTHER): Payer: BLUE CROSS/BLUE SHIELD | Admitting: Obstetrics and Gynecology

## 2017-07-25 VITALS — BP 102/66 | HR 94 | Ht 63.0 in | Wt 154.0 lb

## 2017-07-25 DIAGNOSIS — Z4889 Encounter for other specified surgical aftercare: Secondary | ICD-10-CM

## 2017-07-25 NOTE — Progress Notes (Signed)
Postpartum Visit  Chief Complaint: No chief complaint on file.   History of Present Illness: Patient is a 41 y.o. [id number] presents for postpartum visit.  Date of delivery: 06/12/2017 Type of delivery: Cesarean Episiotomy No.  Laceration: N/A Pregnancy or labor problems:  none Any problems since the delivery:  none  Newborn Details:  SINGLETON :  1. Birth weight: 7lbs 11oz Maternal Details:  Breast Feeding:  yes Post partum depression/anxiety noted:  no Edinburgh Post-Partum Depression Score:  3  Date of last PAP: 07/09/15  NIL HPV negative  Review of Systems: ROS  Past Medical History:  Past Medical History:  Diagnosis Date  . Depression     Past Surgical History:  Past Surgical History:  Procedure Laterality Date  . CESAREAN SECTION  2015  . CESAREAN SECTION WITH BILATERAL TUBAL LIGATION  06/12/2017   Procedure: CESAREAN SECTION WITH BILATERAL TUBAL LIGATION;  Surgeon: [name], MD;  Location: [facility];  Service: Obstetrics;;  . DILATION AND CURETTAGE OF UTERUS    . DILATION AND EVACUATION N/A 08/08/2016   Procedure: DILATATION AND EVACUATION;  Surgeon: [name], MD;  Location: [facility];  Service: Gynecology;  Laterality: N/A;  . PILONIDAL CYST EXCISION    . VACUUM ASSISTED VAGINAL DELIVERY  2012  . WISDOM TOOTH EXTRACTION      Family History:  Family History  Problem Relation Age of Onset  . Heart disease Maternal Grandmother   . Hypertension Paternal Grandmother   . Migraines Paternal Grandmother   . Neurologic Disorder Mother     Social History:  Social History   Social History  . Marital status: Married    Spouse name: N/A  . Number of children: N/A  . Years of education: N/A   Occupational History  . Not on file.   Social History Main Topics  . Smoking status: Never Smoker  . Smokeless tobacco: Never Used  . Alcohol use Yes     Comment: occassional when not pregnant  . Drug use: No  . Sexual activity: Yes   Other  Topics Concern  . Not on file   Social History Narrative  . No narrative on file    Allergies:  Allergies  Allergen Reactions  . Prozac [Fluoxetine Hcl] Itching  . Demerol [Meperidine] Nausea And Vomiting    Medications: Prior to Admission medications   Medication Sig Start Date End Date Taking? Authorizing Provider  citalopram (CELEXA) 20 MG tablet Take 20 mg by mouth every morning.   Yes [provider]  mometasone (ELOCON) 0.1 % ointment Apply 1 application topically daily as needed (dry skin).   Yes [provider]  Prenatal Vit-Fe Fumarate-FA (PRENATAL MULTIVITAMIN) TABS tablet Take 1 tablet by mouth daily with breakfast.    Yes [provider]    Physical Exam Vitals:  Vitals:   07/25/17 0900  BP: 102/66  Pulse: 94    General: NAD HEENT: normocephalic, anicteric Pulmonary: No increased work of breathing Abdomen: NABS, soft, non-tender, non-distended.  Umbilicus without lesions.  No hepatomegaly, splenomegaly or masses palpable. No evidence of hernia. Incision D/C/I Genitourinary:  External: Normal external female genitalia.  Normal urethral meatus, normal  Bartholin's and Skene's glands.    Vagina: Normal vaginal mucosa, no evidence of prolapse.    Cervix: Grossly normal in appearance, no bleeding  Uterus: Non-enlarged, mobile, normal contour.  No CMT  Adnexa: ovaries non-enlarged, no adnexal masses  Rectal: deferred Extremities: no edema, erythema, or tenderness Neurologic: Grossly intact Psychiatric: mood  appropriate, affect full  Assessment: 41 y.o. [id number] presenting for 6 week postpartum visit  Plan: Problem List Items Addressed This Visit    None    Visit Diagnoses    Postoperative visit    -  Primary       1) Contraception - s/p BTL  2)  Pap - ASCCP guidelines and rational discussed.  Patient opts for q3year screening interval  3) Patient underwent screening for postpartum depression with no concerns noted. -  continue current dose of citalopram  4) Follow up 1 year for routine annual exam

## 2017-08-22 ENCOUNTER — Encounter: Payer: Self-pay | Admitting: Obstetrics and Gynecology

## 2017-10-29 ENCOUNTER — Other Ambulatory Visit: Payer: Self-pay | Admitting: Obstetrics and Gynecology

## 2017-10-29 NOTE — Telephone Encounter (Signed)
6 week postpartum was 07/2017

## 2017-11-12 ENCOUNTER — Encounter: Payer: Self-pay | Admitting: Obstetrics and Gynecology

## 2017-11-12 ENCOUNTER — Ambulatory Visit: Payer: BLUE CROSS/BLUE SHIELD | Admitting: Obstetrics and Gynecology

## 2017-11-12 VITALS — BP 114/68 | HR 84 | Ht 63.0 in | Wt 163.0 lb

## 2017-11-12 DIAGNOSIS — N764 Abscess of vulva: Secondary | ICD-10-CM

## 2017-11-12 NOTE — Progress Notes (Signed)
Obstetrics & Gynecology Office Visit   Chief Complaint:  Chief Complaint  Patient presents with  . vaginal cyst    left vaginal cyst abcess    History of Present Illness: 41 year old [phone] presenting for 1 week l72eft labial swelling and tenderness.  Symptoms have improved since initial onset.  No fevers, no chills, no discharge.  She has had similar symptoms previously at the same site.  No trauma or inciting event.  No changes in soaps or detergents.  No associated with shaving.  She is still currently breastfeeding.  She is interested to see if there are any options for treatment and to prevent recurrence.     Review of Systems: Review of Systems  Constitutional: Negative for chills and fever.  Skin: Positive for rash.     Past Medical History:  Past Medical History:  Diagnosis Date  . Depression     Past Surgical History:  Past Surgical History:  Procedure Laterality Date  . CESAREAN SECTION  2015  . CESAREAN SECTION WITH BILATERAL TUBAL LIGATION  06/12/2017   Procedure: CESAREAN SECTION WITH BILATERAL TUBAL LIGATION;  Surgeon: Vena AustriaStaebler, Tyrie Porzio, MD;  Location: [facility];  Service: Obstetrics;;  . DILATION AND CURETTAGE OF UTERUS    . DILATION AND EVACUATION N/A 08/08/2016   Procedure: DILATATION AND EVACUATION;  Surgeon: Vena AustriaAndreas Angelice Piech, MD;  Location: [facility];  Service: Gynecology;  Laterality: N/A;  . PILONIDAL CYST EXCISION    . VACUUM ASSISTED VAGINAL DELIVERY  2012  . WISDOM TOOTH EXTRACTION      Gynecologic History: Patient's last menstrual period was 11/01/2017.  Obstetric History: [id number]  Family History:  Family History  Problem Relation Age of Onset  . Heart disease Maternal Grandmother   . Hypertension Paternal Grandmother   . Migraines Paternal Grandmother   . Neurologic Disorder Mother     Social History:  Social History   Socioeconomic History  . Marital status: Married    Spouse name: Not on file  . Number of children: Not on file    . Years of education: Not on file  . Highest education level: Not on file  Social Needs  . Financial resource strain: Not on file  . Food insecurity - worry: Not on file  . Food insecurity - inability: Not on file  . Transportation needs - medical: Not on file  . Transportation needs - non-medical: Not on file  Occupational History  . Not on file  Tobacco Use  . Smoking status: Never Smoker  . Smokeless tobacco: Never Used  Substance and Sexual Activity  . Alcohol use: Yes    Comment: occassional when not pregnant  . Drug use: No  . Sexual activity: Yes  Other Topics Concern  . Not on file  Social History Narrative  . Not on file    Allergies:  Allergies  Allergen Reactions  . Prozac [Fluoxetine Hcl] Itching  . Demerol [Meperidine] Nausea And Vomiting    Medications: Prior to Admission medications   Medication Sig Start Date End Date Taking? Authorizing Provider  citalopram (CELEXA) 20 MG tablet TAKE ONE TABLET BY MOUTH ONCE DAILY 10/29/17   Vena AustriaStaebler, Malia Corsi, MD  mometasone (ELOCON) 0.1 % ointment Apply 1 application topically daily as needed (dry skin).    [provider]  Prenatal Vit-Fe Fumarate-FA (PRENATAL MULTIVITAMIN) TABS tablet Take 1 tablet by mouth daily with breakfast.     [provider]    Physical Exam Vitals:  Vitals:   11/12/17  1106  BP: 114/68  Pulse: 84   Patient's last menstrual period was 11/01/2017.  General: NAD HEENT: normocephalic, anicteric Pulmonary: No increased work of breathing Genitourinary:  External: Normal external female genitalia.  Normal urethral meatus, normal.  The area in question is a small furuncle, no significant induration or erythema, no drainable fluid collection   Bartholin's and Skene's glands.    Rectal: deferred  Lymphatic: no evidence of inguinal lymphadenopathy Extremities: no edema, erythema, or tenderness Neurologic: Grossly intact Psychiatric: mood appropriate, affect full  Female  chaperone present for pelvic and breast  portions of the physical exam  Assessment: 41 y.o. [id number] labial furuncle  Plan: Problem List Items Addressed This Visit    None    Visit Diagnoses    Furuncle of labia majora    -  Primary     - Discussed etiology and risk factor for development of furuncles - discussed use of antibiotics, sitz baths, and warm compresses in acute setting - presently not amenable to I&D declines antibiotics as has largely resolved - Follow up for next annual exam or prn

## 2017-12-25 DIAGNOSIS — M545 Low back pain: Secondary | ICD-10-CM | POA: Diagnosis not present

## 2018-01-03 DIAGNOSIS — M545 Low back pain: Secondary | ICD-10-CM | POA: Diagnosis not present

## 2018-01-07 DIAGNOSIS — M545 Low back pain: Secondary | ICD-10-CM | POA: Diagnosis not present

## 2018-01-10 DIAGNOSIS — M545 Low back pain: Secondary | ICD-10-CM | POA: Diagnosis not present

## 2018-01-14 DIAGNOSIS — M545 Low back pain: Secondary | ICD-10-CM | POA: Diagnosis not present

## 2018-01-17 DIAGNOSIS — M545 Low back pain: Secondary | ICD-10-CM | POA: Diagnosis not present

## 2018-01-22 DIAGNOSIS — M545 Low back pain: Secondary | ICD-10-CM | POA: Diagnosis not present

## 2018-01-24 ENCOUNTER — Other Ambulatory Visit: Payer: Self-pay | Admitting: Obstetrics and Gynecology

## 2018-01-24 MED ORDER — CITALOPRAM HYDROBROMIDE 20 MG PO TABS: 20.0000 mg | ORAL_TABLET | Freq: Every day | ORAL | 2 refills | Status: DC

## 2018-01-26 DIAGNOSIS — M545 Low back pain: Secondary | ICD-10-CM | POA: Diagnosis not present

## 2018-01-31 DIAGNOSIS — M545 Low back pain: Secondary | ICD-10-CM | POA: Diagnosis not present

## 2018-08-20 ENCOUNTER — Ambulatory Visit (INDEPENDENT_AMBULATORY_CARE_PROVIDER_SITE_OTHER): Payer: BLUE CROSS/BLUE SHIELD | Admitting: Obstetrics and Gynecology

## 2018-08-20 ENCOUNTER — Other Ambulatory Visit (HOSPITAL_COMMUNITY)
Admission: RE | Admit: 2018-08-20 | Discharge: 2018-08-20 | Disposition: A | Discharge status: Home / Self Care | Payer: BLUE CROSS/BLUE SHIELD | Source: Ambulatory Visit | Attending: Obstetrics and Gynecology | Admitting: Obstetrics and Gynecology

## 2018-08-20 ENCOUNTER — Encounter: Payer: Self-pay | Admitting: Obstetrics and Gynecology

## 2018-08-20 VITALS — BP 102/78 | HR 111 | Ht 63.0 in | Wt 162.0 lb

## 2018-08-20 DIAGNOSIS — Z124 Encounter for screening for malignant neoplasm of cervix: Secondary | ICD-10-CM

## 2018-08-20 DIAGNOSIS — Z01419 Encounter for gynecological examination (general) (routine) without abnormal findings: Secondary | ICD-10-CM

## 2018-08-20 DIAGNOSIS — Z1239 Encounter for other screening for malignant neoplasm of breast: Secondary | ICD-10-CM

## 2018-08-20 DIAGNOSIS — Z1231 Encounter for screening mammogram for malignant neoplasm of breast: Secondary | ICD-10-CM | POA: Diagnosis not present

## 2018-08-20 LAB — HM PAP SMEAR: HM Pap smear: NORMAL

## 2018-08-20 MED ORDER — CITALOPRAM HYDROBROMIDE 20 MG PO TABS: 20.0000 mg | ORAL_TABLET | Freq: Every day | ORAL | 3 refills | Status: DC

## 2018-08-20 NOTE — Patient Instructions (Signed)
[facility] [address] [place] [address]  [facility] [place]  [address] [place]  [address]  Phone: [phone]  

## 2018-08-20 NOTE — Progress Notes (Signed)
Gynecology Annual Exam  PCP: Jerl Mina, MD  Chief Complaint:  Chief Complaint  Patient presents with  . Gynecologic Exam    History of Present Illness: Patient is a 42 y.o. [id number] presents for annual exam. The patient has no complaints today.   LMP: Patient's last menstrual period was 08/08/2018 (exact date). Average Interval: regular, 28 days Duration of flow: 5 days Heavy Menses: no Clots: no Intermenstrual Bleeding: no Postcoital Bleeding: no Dysmenorrhea: no   The patient is sexually active. She currently uses tubal ligation for contraception. She denies dyspareunia.  The patient does not perform self breast exams.  There is no notable family history of breast or ovarian cancer in her family.  The patient wears seatbelts: yes.   The patient has regular exercise: not asked.    The patient denies current symptoms of depression.  Stable on current dose citalopram  Review of Systems: Review of Systems  Constitutional: Negative for chills and fever.  HENT: Negative for congestion.   Respiratory: Negative for cough and shortness of breath.   Cardiovascular: Negative for chest pain and palpitations.  Gastrointestinal: Negative for abdominal pain, constipation, diarrhea, heartburn, nausea and vomiting.  Genitourinary: Negative for dysuria, frequency and urgency.  Skin: Negative for itching and rash.  Neurological: Negative for dizziness and headaches.  Endo/Heme/Allergies: Negative for polydipsia.  Psychiatric/Behavioral: Negative for depression.    Past Medical History:  Past Medical History:  Diagnosis Date  . Depression     Past Surgical History:  Past Surgical History:  Procedure Laterality Date  . CESAREAN SECTION  2015  . CESAREAN SECTION WITH BILATERAL TUBAL LIGATION  06/12/2017   Procedure: CESAREAN SECTION WITH BILATERAL TUBAL LIGATION;  Surgeon: [name], MD;  Location: [facility];  Service: Obstetrics;;  . DILATION AND CURETTAGE OF UTERUS     . DILATION AND EVACUATION N/A 08/08/2016   Procedure: DILATATION AND EVACUATION;  Surgeon: [name], MD;  Location: [facility];  Service: Gynecology;  Laterality: N/A;  . PILONIDAL CYST EXCISION    . TUBAL LIGATION  06/12/2017  . VACUUM ASSISTED VAGINAL DELIVERY  2012  . WISDOM TOOTH EXTRACTION      Gynecologic History:  Patient's last menstrual period was 08/08/2018 (exact date). Contraception: tubal ligation Last Pap: Results were: 07/09/2015 NIL and HR HPV negative   Obstetric History: [id number]  Family History:  Family History  Problem Relation Age of Onset  . Heart disease Maternal Grandmother   . Hypertension Paternal Grandmother   . Migraines Paternal Grandmother   . Neurologic Disorder Mother     Social History:  Social History   Socioeconomic History  . Marital status: Married    Spouse name: Not on file  . Number of children: Not on file  . Years of education: Not on file  . Highest education level: Not on file  Occupational History  . Not on file  Social Needs  . Financial resource strain: Not on file  . Food insecurity:    Worry: Not on file    Inability: Not on file  . Transportation needs:    Medical: Not on file    Non-medical: Not on file  Tobacco Use  . Smoking status: Never Smoker  . Smokeless tobacco: Never Used  Substance and Sexual Activity  . Alcohol use: Yes    Comment: occassional when not pregnant  . Drug use: No  . Sexual activity: Yes    Birth control/protection: Surgical    Comment: BTL  Lifestyle  .  Physical activity:    Days per week: 1 day    Minutes per session: 60 min  . Stress: To some extent  Relationships  . Social connections:    Talks on phone: More than three times a week    Gets together: Once a week    Attends religious service: More than 4 times per year    Active member of club or organization: No    Attends meetings of clubs or organizations: Never    Relationship status: Married  . Intimate partner  violence:    Fear of current or ex partner: No    Emotionally abused: No    Physically abused: No    Forced sexual activity: No  Other Topics Concern  . Not on file  Social History Narrative  . Not on file    Allergies:  Allergies  Allergen Reactions  . Prozac [Fluoxetine Hcl] Itching  . Demerol [Meperidine] Nausea And Vomiting    Medications: Prior to Admission medications   Medication Sig Start Date End Date Taking? Authorizing Provider  citalopram (CELEXA) 20 MG tablet Take 1 tablet (20 mg total) by mouth daily. 01/24/18  Yes [name], MD  mometasone (ELOCON) 0.1 % ointment Apply 1 application topically daily as needed (dry skin).   Yes [provider]  Prenatal Vit-Fe Fumarate-FA (PRENATAL MULTIVITAMIN) TABS tablet Take 1 tablet by mouth daily with breakfast.    Yes [provider]  Triamcinolone Acetonide 0.025 % LOTN Apply topically.   Yes [provider]    Physical Exam Vitals: Blood pressure 102/78, pulse (!) 111, height 5\' 3"  (1.6 m), weight 162 lb (73.5 kg), last menstrual period 08/08/2018, currently breastfeeding.  General: NAD HEENT: normocephalic, anicteric Thyroid: no enlargement, no palpable nodules Pulmonary: No increased work of breathing, CTAB Cardiovascular: RRR, distal pulses 2+ Breast: Breast symmetrical, no tenderness, no palpable nodules or masses, no skin or nipple retraction present, no nipple discharge.  No axillary or supraclavicular lymphadenopathy. Abdomen: NABS, soft, non-tender, non-distended.  Umbilicus without lesions.  No hepatomegaly, splenomegaly or masses palpable. No evidence of hernia  Genitourinary:  External: Normal external female genitalia.  Normal urethral meatus, normal Bartholin's and Skene's glands.    Vagina: Normal vaginal mucosa, no evidence of prolapse.    Cervix: Grossly normal in appearance, no bleeding  Uterus: Non-enlarged, mobile, normal contour.  No CMT  Adnexa: ovaries non-enlarged,  no adnexal masses  Rectal: deferred  Lymphatic: no evidence of inguinal lymphadenopathy Extremities: no edema, erythema, or tenderness Neurologic: Grossly intact Psychiatric: mood appropriate, affect full  Female chaperone present for pelvic and breast  portions of the physical exam    Assessment: 42 y.o. [id number] routine annual exam  Plan: Problem List Items Addressed This Visit    None    Visit Diagnoses    Encounter for gynecological examination without abnormal finding    -  Primary   Screening for malignant neoplasm of cervix       Relevant Orders   Cytology - PAP   Breast screening       Relevant Orders   MM 3D SCREEN BREAST BILATERAL      1) Mammogram - recommend yearly screening mammogram.  Mammogram Was ordered today - order placed currently still breast feeding  2) STI screening  was notoffered and therefore not obtained  3) ASCCP guidelines and rational discussed.  Patient opts for every 3 years screening interval  4) Contraception - the patient is currently using  tubal ligation.  She is  happy with her current form of contraception and plans to continue  5) Colonoscopy -- Screening recommended starting at age 95 for average risk individuals, age 69 for individuals deemed at increased risk (including African Americans) and recommended to continue until age 23.  For patient age 39-85 individualized approach is recommended.  Gold standard screening is via colonoscopy, Cologuard screening is an acceptable alternative for patient unwilling or unable to undergo colonoscopy.  "Colorectal cancer screening for average?risk adults: 2018 guideline update from the American Cancer Society"[place]: A Cancer Journal for Clinicians: May 16, 2017   6) Routine healthcare maintenance including cholesterol, diabetes screening discussed managed by PCP  7) Refill citalopram  8)  Return in about 1 year (around 08/21/2019) for annual.   [name], MD, [facility], [facility] 08/20/2018, 10:03 AM

## 2018-08-23 LAB — CYTOLOGY - PAP
DIAGNOSIS: NEGATIVE
HPV (WINDOPATH): NOT DETECTED

## 2018-09-04 DIAGNOSIS — H5211 Myopia, right eye: Secondary | ICD-10-CM | POA: Diagnosis not present

## 2018-09-04 DIAGNOSIS — H52223 Regular astigmatism, bilateral: Secondary | ICD-10-CM | POA: Diagnosis not present

## 2018-11-23 DIAGNOSIS — M2550 Pain in unspecified joint: Secondary | ICD-10-CM | POA: Diagnosis not present

## 2018-11-23 DIAGNOSIS — R21 Rash and other nonspecific skin eruption: Secondary | ICD-10-CM | POA: Diagnosis not present

## 2019-08-26 ENCOUNTER — Encounter: Payer: Self-pay | Admitting: Obstetrics and Gynecology

## 2019-08-26 ENCOUNTER — Other Ambulatory Visit: Payer: Self-pay

## 2019-08-26 ENCOUNTER — Ambulatory Visit (INDEPENDENT_AMBULATORY_CARE_PROVIDER_SITE_OTHER): Payer: BC Managed Care – PPO | Admitting: Obstetrics and Gynecology

## 2019-08-26 VITALS — BP 114/76 | HR 87 | Ht 63.0 in | Wt 140.0 lb

## 2019-08-26 DIAGNOSIS — N643 Galactorrhea not associated with childbirth: Secondary | ICD-10-CM | POA: Diagnosis not present

## 2019-08-26 DIAGNOSIS — Z01419 Encounter for gynecological examination (general) (routine) without abnormal findings: Secondary | ICD-10-CM

## 2019-08-26 DIAGNOSIS — F32A Depression, unspecified: Secondary | ICD-10-CM

## 2019-08-26 DIAGNOSIS — F329 Major depressive disorder, single episode, unspecified: Secondary | ICD-10-CM

## 2019-08-26 DIAGNOSIS — Z23 Encounter for immunization: Secondary | ICD-10-CM

## 2019-08-26 DIAGNOSIS — N939 Abnormal uterine and vaginal bleeding, unspecified: Secondary | ICD-10-CM | POA: Diagnosis not present

## 2019-08-26 DIAGNOSIS — Z1239 Encounter for other screening for malignant neoplasm of breast: Secondary | ICD-10-CM

## 2019-08-26 DIAGNOSIS — R6882 Decreased libido: Secondary | ICD-10-CM

## 2019-08-26 MED ORDER — CITALOPRAM HYDROBROMIDE 40 MG PO TABS: 40.0000 mg | ORAL_TABLET | Freq: Every day | ORAL | 11 refills | Status: DC

## 2019-08-26 NOTE — Progress Notes (Signed)
Gynecology Annual Exam  PCP: Jerl MinaHedrick, James, MD  Chief Complaint:  Chief Complaint  Patient presents with  . Gynecologic Exam    Discuss vasomotor symptoms/irregular cycles/acne    History of Present Illness: Patient is a 43 y.o. Adrienne Richards presents for annual exam. The patient has no complaints today.   LMP: Patient's last menstrual period was 08/04/2019 (exact date). Last 3 month has noted cycles spacing closer together 2-3 weeks, flow remains normal in length and amount.  She does report her sister who is 5 years older has undergone menopause already.  No intermenstrual spotting.   The patient is sexually active. She currently uses tubal ligation for contraception. She denies dyspareunia.  The patient does perform self breast exams.  There is no notable family history of breast or ovarian cancer in her family.  The patient wears seatbelts: yes.   The patient has regular exercise: not asked.    The patient denies current symptoms of depression.    Review of Systems: ROS  Past Medical History:  Past Medical History:  Diagnosis Date  . Depression     Past Surgical History:  Past Surgical History:  Procedure Laterality Date  . CESAREAN SECTION  2015  . CESAREAN SECTION WITH BILATERAL TUBAL LIGATION  06/12/2017   Procedure: CESAREAN SECTION WITH BILATERAL TUBAL LIGATION;  Surgeon: Vena AustriaStaebler, Zahira Brummond, MD;  Location: [facility] ORS;  Service: Obstetrics;;  . DILATION AND CURETTAGE OF UTERUS    . DILATION AND EVACUATION N/A 08/08/2016   Procedure: DILATATION AND EVACUATION;  Surgeon: Vena AustriaAndreas Rykar Lebleu, MD;  Location: [facility] ORS;  Service: Gynecology;  Laterality: N/A;  . PILONIDAL CYST EXCISION    . TUBAL LIGATION  06/12/2017  . VACUUM ASSISTED VAGINAL DELIVERY  2012  . WISDOM TOOTH EXTRACTION      Gynecologic History:  Patient's last menstrual period was 08/04/2019 (exact date). Contraception: tubal ligation Last Pap: Results were: 08/20/2018 NIL and HR HPV negative   Obstetric  History: [id number]  Family History:  Family History  Problem Relation Age of Onset  . Heart disease Maternal Grandmother   . Hypertension Paternal Grandmother   . Migraines Paternal Grandmother   . Neurologic Disorder Mother     Social History:  Social History   Socioeconomic History  . Marital status: Married    Spouse name: Not on file  . Number of children: Not on file  . Years of education: Not on file  . Highest education level: Not on file  Occupational History  . Not on file  Social Needs  . Financial resource strain: Not on file  . Food insecurity    Worry: Not on file    Inability: Not on file  . Transportation needs    Medical: Not on file    Non-medical: Not on file  Tobacco Use  . Smoking status: Never Smoker  . Smokeless tobacco: Never Used  Substance and Sexual Activity  . Alcohol use: Yes    Comment: occassional when not pregnant  . Drug use: No  . Sexual activity: Yes    Birth control/protection: Surgical    Comment: BTL  Lifestyle  . Physical activity    Days per week: 1 day    Minutes per session: 60 min  . Stress: To some extent  Relationships  . Social connections    Talks on phone: More than three times a week    Gets together: Once a week    Attends religious service: More than 4 times per year  Active member of club or organization: No    Attends meetings of clubs or organizations: Never    Relationship status: Married  . Intimate partner violence    Fear of current or ex partner: No    Emotionally abused: No    Physically abused: No    Forced sexual activity: No  Other Topics Concern  . Not on file  Social History Narrative  . Not on file    Allergies:  Allergies  Allergen Reactions  . Prozac [Fluoxetine Hcl] Itching  . Demerol [Meperidine] Nausea And Vomiting    Medications: Prior to Admission medications   Medication Sig Start Date End Date Taking? Authorizing Provider  citalopram (CELEXA) 20 MG tablet Take 1 tablet  (20 mg total) by mouth daily. 08/20/18  Yes [name], MD  mometasone (ELOCON) 0.1 % ointment Apply 1 application topically daily as needed (dry skin).   Yes [provider]  Prenatal Vit-Fe Fumarate-FA (PRENATAL MULTIVITAMIN) TABS tablet Take 1 tablet by mouth daily with breakfast.    Yes [provider]  Triamcinolone Acetonide 0.025 % LOTN Apply topically.   Yes [provider]    Physical Exam Vitals: Blood pressure 114/76, pulse 87, height 5\' 3"  (1.6 m), weight 140 lb (63.5 kg), last menstrual period 08/04/2019, currently breastfeeding.  General: NAD HEENT: normocephalic, anicteric Thyroid: no enlargement, no palpable nodules Pulmonary: No increased work of breathing, CTAB Cardiovascular: RRR, distal pulses 2+ Breast: Breast symmetrical, no tenderness, no palpable nodules or masses, no skin or nipple retraction present, no nipple discharge.  No axillary or supraclavicular lymphadenopathy. Abdomen: NABS, soft, non-tender, non-distended.  Umbilicus without lesions.  No hepatomegaly, splenomegaly or masses palpable. No evidence of hernia  Genitourinary:  External: Normal external female genitalia.  Normal urethral meatus, normal Bartholin's and Skene's glands.    Vagina: Normal vaginal mucosa, no evidence of prolapse.    Cervix: Grossly normal in appearance, no bleeding  Uterus: Non-enlarged, mobile, normal contour.  No CMT  Adnexa: ovaries non-enlarged, no adnexal masses  Rectal: deferred  Lymphatic: no evidence of inguinal lymphadenopathy Extremities: no edema, erythema, or tenderness Neurologic: Grossly intact Psychiatric: mood appropriate, affect full  Female chaperone present for pelvic and breast  portions of the physical exam    Assessment: 43 y.o. [id number] routine annual exam  Plan: Problem List Items Addressed This Visit    None    Visit Diagnoses    Flu vaccine need    -  Primary   Relevant Orders   Flu Vaccine QUAD 36+ mos IM  (Completed)   Encounter for gynecological examination without abnormal finding       Breast screening       Relevant Orders   MM 3D SCREEN BREAST BILATERAL   Anxiety and depression       Relevant Medications   citalopram (CELEXA) 40 MG tablet   Decreased libido       Relevant Orders   FSH   Estradiol   TSH   Galactorrhea       Relevant Orders   Prolactin   Abnormal uterine bleeding       Relevant Orders   Prolactin   FSH   Estradiol   TSH      1) Mammogram - recommend yearly screening mammogram.  Mammogram Was ordered today   2) STI screening  was notoffered and therefore not obtained  3) ASCCP guidelines and rational discussed.  Patient opts for every 3 years screening interval  4) Contraception - the patient  is currently using  tubal ligation.  She is happy with her current form of contraception and plans to continue  - if cycles remain irregular or become heavier discussed proceed with more in depth work up.  FSH/etradiol, prolactin (stopped breastfeeding but some continued galactorrhea, and TSH) - discussed cycle control option in women over 40  5) Colonoscopy -- screening at age 78  6) Routine healthcare maintenance including cholesterol, diabetes screening discussed managed by PCP  7) Return in about 1 year (around 08/25/2020) for annual.   Malachy Mood, MD, [place], [facility] 08/26/2019, 10:03 AM

## 2019-08-26 NOTE — Patient Instructions (Signed)
[facility] [address] [place] [address]  [facility]  [address] [place] [address]  Phone: [phone]  

## 2019-08-27 LAB — ESTRADIOL: Estradiol: 109 pg/mL

## 2019-08-27 LAB — FOLLICLE STIMULATING HORMONE: FSH: 3.7 m[IU]/mL

## 2019-08-27 LAB — TSH: TSH: 4.56 u[IU]/mL — ABNORMAL HIGH (ref 0.450–4.500)

## 2019-08-27 LAB — PROLACTIN: Prolactin: 18.7 ng/mL (ref 4.8–23.3)

## 2019-08-28 NOTE — Progress Notes (Signed)
Can we add on a free T4 level given elevated TSH

## 2019-08-28 NOTE — Progress Notes (Signed)
Got it.

## 2019-08-30 LAB — SPECIMEN STATUS REPORT

## 2019-08-30 LAB — T4, FREE: Free T4: 1 ng/dL (ref 0.82–1.77)

## 2019-09-17 ENCOUNTER — Other Ambulatory Visit: Payer: Self-pay | Admitting: Obstetrics and Gynecology

## 2019-09-30 ENCOUNTER — Ambulatory Visit
Admission: RE | Admit: 2019-09-30 | Discharge: 2019-09-30 | Disposition: A | Discharge status: Home / Self Care | Payer: BC Managed Care – PPO | Source: Ambulatory Visit | Attending: Obstetrics and Gynecology | Admitting: Obstetrics and Gynecology

## 2019-09-30 DIAGNOSIS — Z1239 Encounter for other screening for malignant neoplasm of breast: Secondary | ICD-10-CM

## 2019-09-30 DIAGNOSIS — Z1231 Encounter for screening mammogram for malignant neoplasm of breast: Secondary | ICD-10-CM | POA: Insufficient documentation

## 2019-10-03 ENCOUNTER — Other Ambulatory Visit: Payer: Self-pay | Admitting: Obstetrics and Gynecology

## 2020-04-20 DIAGNOSIS — F331 Major depressive disorder, recurrent, moderate: Secondary | ICD-10-CM | POA: Diagnosis not present

## 2020-04-29 DIAGNOSIS — F331 Major depressive disorder, recurrent, moderate: Secondary | ICD-10-CM | POA: Diagnosis not present

## 2020-05-04 DIAGNOSIS — F331 Major depressive disorder, recurrent, moderate: Secondary | ICD-10-CM | POA: Diagnosis not present

## 2020-05-11 DIAGNOSIS — F331 Major depressive disorder, recurrent, moderate: Secondary | ICD-10-CM | POA: Diagnosis not present

## 2020-05-18 DIAGNOSIS — F331 Major depressive disorder, recurrent, moderate: Secondary | ICD-10-CM | POA: Diagnosis not present

## 2020-06-08 DIAGNOSIS — F331 Major depressive disorder, recurrent, moderate: Secondary | ICD-10-CM | POA: Diagnosis not present

## 2020-06-15 DIAGNOSIS — F331 Major depressive disorder, recurrent, moderate: Secondary | ICD-10-CM | POA: Diagnosis not present

## 2020-06-22 DIAGNOSIS — F331 Major depressive disorder, recurrent, moderate: Secondary | ICD-10-CM | POA: Diagnosis not present

## 2020-06-30 DIAGNOSIS — F331 Major depressive disorder, recurrent, moderate: Secondary | ICD-10-CM | POA: Diagnosis not present

## 2020-07-01 NOTE — Telephone Encounter (Signed)
Appointment can be phone in the next 1-2 weeks

## 2020-07-13 DIAGNOSIS — F331 Major depressive disorder, recurrent, moderate: Secondary | ICD-10-CM | POA: Diagnosis not present

## 2020-07-16 ENCOUNTER — Other Ambulatory Visit: Payer: Self-pay

## 2020-07-16 ENCOUNTER — Ambulatory Visit (INDEPENDENT_AMBULATORY_CARE_PROVIDER_SITE_OTHER): Payer: BC Managed Care – PPO | Admitting: Obstetrics and Gynecology

## 2020-07-16 ENCOUNTER — Encounter: Payer: Self-pay | Admitting: Obstetrics and Gynecology

## 2020-07-16 VITALS — Wt 143.0 lb

## 2020-07-16 DIAGNOSIS — F419 Anxiety disorder, unspecified: Secondary | ICD-10-CM | POA: Diagnosis not present

## 2020-07-16 DIAGNOSIS — F329 Major depressive disorder, single episode, unspecified: Secondary | ICD-10-CM

## 2020-07-16 DIAGNOSIS — F32A Depression, unspecified: Secondary | ICD-10-CM

## 2020-07-16 MED ORDER — HYDROXYZINE PAMOATE 25 MG PO CAPS: 25.0000 mg | ORAL_CAPSULE | Freq: Four times a day (QID) | ORAL | 2 refills | Status: DC | PRN

## 2020-07-16 MED ORDER — BUSPIRONE HCL 7.5 MG PO TABS: 7.5000 mg | ORAL_TABLET | Freq: Two times a day (BID) | ORAL | 2 refills | Status: DC

## 2020-07-16 NOTE — Progress Notes (Signed)
I connected with Adrienne Richards on 07/16/20 at 11:10 AM EDT by telephone and verified that I am speaking with the correct person using two identifiers.   I discussed the limitations, risks, security and privacy concerns of performing an evaluation and management service by telephone and the availability of in person appointments. I also discussed with the patient that there may be a patient responsible charge related to this service. The patient expressed understanding and agreed to proceed.  The patient was at home I spoke with the patient from my workstation phone The names of people involved in this encounter were: Adrienne Richards , and Adrienne Richards   Obstetrics & Gynecology Office Visit   Chief Complaint:  Chief Complaint  Patient presents with  . Follow-up    Depression/Anxiety    History of Present Illness: The patient is a 44 y.o. female presenting follow up for symptoms of anxiety and depression.  The patient is currently taking citalopram 40mg  for the management of her symptoms.  She has had recent situational stressors.  COVID pandemic and children, opposing political views with her husband, as well as a crisis of faith.  She reports significant improvement in symptoms on citalopram 40mg .  No side-effects noted,  The patient does have a pre-existing history of depression and anxiety.  She  does not a prior history of suicide attempts.    Review of Systems: review of systems negative unless otherwise noted in HPI  Past Medical History:  Past Medical History:  Diagnosis Date  . Depression     Past Surgical History:  Past Surgical History:  Procedure Laterality Date  . CESAREAN SECTION  2015  . CESAREAN SECTION WITH BILATERAL TUBAL LIGATION  06/12/2017   Procedure: CESAREAN SECTION WITH BILATERAL TUBAL LIGATION;  Surgeon: , MD;  Location: [facility] ORS;  Service: Obstetrics;;  . DILATION AND CURETTAGE OF UTERUS    . DILATION AND EVACUATION N/A 08/08/2016     Procedure: DILATATION AND EVACUATION;  Surgeon: Adrienne Austria, MD;  Location: [facility] ORS;  Service: Gynecology;  Laterality: N/A;  . PILONIDAL CYST EXCISION    . TUBAL LIGATION  06/12/2017  . VACUUM ASSISTED VAGINAL DELIVERY  2012  . WISDOM TOOTH EXTRACTION      Gynecologic History: Patient's last menstrual period was 07/14/2020 (exact date).  Obstetric History: 2013  Family History:  Family History  Problem Relation Age of Onset  . Heart disease Maternal Grandmother   . Hypertension Paternal Grandmother   . Migraines Paternal Grandmother   . Neurologic Disorder Mother     Social History:  Social History   Socioeconomic History  . Marital status: Married    Spouse name: Not on file  . Number of children: Not on file  . Years of education: Not on file  . Highest education level: Not on file  Occupational History  . Not on file  Tobacco Use  . Smoking status: Never Smoker  . Smokeless tobacco: Never Used  Vaping Use  . Vaping Use: Never used  Substance and Sexual Activity  . Alcohol use: Yes    Comment: occassional when not pregnant  . Drug use: No  . Sexual activity: Yes    Birth control/protection: Surgical    Comment: BTL  Other Topics Concern  . Not on file  Social History Narrative  . Not on file   Social Determinants of Health   Financial Resource Strain:   . Difficulty of Paying Living Expenses:   Food Insecurity:   .  Worried About Programme researcher, broadcasting/film/video in the Last Year:   . Barista in the Last Year:   Transportation Needs:   . Freight forwarder (Medical):   Marland Kitchen Lack of Transportation (Non-Medical):   Physical Activity:   . Days of Exercise per Week:   . Minutes of Exercise per Session:   Stress:   . Feeling of Stress :   Social Connections:   . Frequency of Communication with Friends and Family:   . Frequency of Social Gatherings with Friends and Family:   . Attends Religious Services:   . Active Member of Clubs or  Organizations:   . Attends Banker Meetings:   Marland Kitchen Marital Status:   Intimate Partner Violence:   . Fear of Current or Ex-Partner:   . Emotionally Abused:   Marland Kitchen Physically Abused:   . Sexually Abused:     Allergies:  Allergies  Allergen Reactions  . Prozac [Fluoxetine Hcl] Itching  . Demerol [Meperidine] Nausea And Vomiting    Medications: Prior to Admission medications   Medication Sig Start Date End Date Taking? Authorizing Provider  citalopram (CELEXA) 40 MG tablet TAKE 1 TABLET BY MOUTH EVERY DAY 09/17/19  Yes Adrienne Austria, MD  mometasone (ELOCON) 0.1 % ointment Apply 1 application topically daily as needed (dry skin).   Yes [provider]    Physical Exam Vitals: There were no vitals filed for this visit. Patient's last menstrual period was 07/14/2020 (exact date).  No physical exam as this was a remote telephone visit to promote social distancing during the current COVID-19 Pandemic   GAD 7 : Generalized Anxiety Score 07/16/2020  Nervous, Anxious, on Edge 1  Control/stop worrying 1  Worry too much - different things 1  Trouble relaxing 0  Restless 0  Easily annoyed or irritable 3  Afraid - awful might happen 0  Total GAD 7 Score 6  Anxiety Difficulty Somewhat difficult    Depression screen PHQ 2/9 07/16/2020  Decreased Interest 2  Down, Depressed, Hopeless 1  PHQ - 2 Score 3  Altered sleeping 0  Tired, decreased energy 3  Change in appetite 0  Feeling bad or failure about yourself  1  Trouble concentrating 0  Moving slowly or fidgety/restless 0  Suicidal thoughts 0  PHQ-9 Score 7  Difficult doing work/chores Somewhat difficult    Depression screen [facility] 2/9 07/16/2020  Decreased Interest 2  Down, Depressed, Hopeless 1  PHQ - 2 Score 3  Altered sleeping 0  Tired, decreased energy 3  Change in appetite 0  Feeling bad or failure about yourself  1  Trouble concentrating 0  Moving slowly or fidgety/restless 0  Suicidal thoughts 0   PHQ-9 Score 7  Difficult doing work/chores Somewhat difficult     Assessment: 44 y.o. [id number] follow up anxiety and depression  Plan: Problem List Items Addressed This Visit    None    Visit Diagnoses    Anxiety and depression    -  Primary   Relevant Medications   busPIRone (BUSPAR) 7.5 MG tablet   hydrOXYzine (VISTARIL) 25 MG capsule      1) Anxiety/Depression - good response but still report increased irritability largely situational - Add prn vistaril - Add buspar 7.5mg  bid  2) Thyroid and B12 screen has been obtained previously  3) Telephone 10:45 min  4) Return in about 2 weeks (around 07/30/2020) for medication follow up.   Adrienne Austria, MD, [facility], [facility]  07/16/2020, 12:23 PM

## 2020-07-20 DIAGNOSIS — F331 Major depressive disorder, recurrent, moderate: Secondary | ICD-10-CM | POA: Diagnosis not present

## 2020-07-27 DIAGNOSIS — F331 Major depressive disorder, recurrent, moderate: Secondary | ICD-10-CM | POA: Diagnosis not present

## 2020-08-03 DIAGNOSIS — F331 Major depressive disorder, recurrent, moderate: Secondary | ICD-10-CM | POA: Diagnosis not present

## 2020-08-06 ENCOUNTER — Ambulatory Visit (INDEPENDENT_AMBULATORY_CARE_PROVIDER_SITE_OTHER): Payer: BC Managed Care – PPO | Admitting: Obstetrics and Gynecology

## 2020-08-06 ENCOUNTER — Encounter: Payer: Self-pay | Admitting: Obstetrics and Gynecology

## 2020-08-06 ENCOUNTER — Other Ambulatory Visit: Payer: Self-pay

## 2020-08-06 DIAGNOSIS — F329 Major depressive disorder, single episode, unspecified: Secondary | ICD-10-CM | POA: Diagnosis not present

## 2020-08-06 DIAGNOSIS — F419 Anxiety disorder, unspecified: Secondary | ICD-10-CM | POA: Diagnosis not present

## 2020-08-06 DIAGNOSIS — F32A Depression, unspecified: Secondary | ICD-10-CM

## 2020-08-06 MED ORDER — BUSPIRONE HCL 15 MG PO TABS: 15.0000 mg | ORAL_TABLET | Freq: Two times a day (BID) | ORAL | 2 refills | Status: DC

## 2020-08-06 NOTE — Progress Notes (Signed)
I connected with Adrienne Richards on 08/06/2020 at  9:50 AM EDT by telephone and verified that I am speaking with the correct person using two identifiers.   I discussed the limitations, risks, security and privacy concerns of performing an evaluation and management service by telephone and the availability of in person appointments. I also discussed with the patient that there may be a patient responsible charge related to this service. The patient expressed understanding and agreed to proceed.  The patient was at home I spoke with the patient from my workstation phone The names of people involved in this encounter were: [name] , and [name]   Obstetrics & Gynecology Office Visit   Chief Complaint:  Chief Complaint  Patient presents with  . Follow-up    Anxiety depression    History of Present Illness: The patient is a 44 y.o. female presenting follow up for symptoms of anxiety and depression.  The patient is currently taking Citalopram 40mg  with addition of buspar 7.5mg  po bid 2 weeks ago for the management of her symptoms.  She has had any recent situational stressors.  Mainly pandemic related and contrasting ideologies surrounding pandemic from her husband.  She reports symptoms of anhedonia, insomnia, irritability and social anxiety.  She denies risk taking behavior, agorophobia, feelings of guilt, feelings of worthlessness, suicidal ideation, homicidal ideation, auditory hallucinations and visual hallucinations. Symptoms have improved since last visit.   Denies headaches or nausea.    The patient does have a pre-existing history of depression and anxiety.  She  does not a prior history of suicide attempts.    Review of Systems: review of systems negative unless otherwise noted in HPI  Past Medical History:  Past Medical History:  Diagnosis Date  . Depression     Past Surgical History:  Past Surgical History:  Procedure Laterality Date  . CESAREAN SECTION   2015  . CESAREAN SECTION WITH BILATERAL TUBAL LIGATION  06/12/2017   Procedure: CESAREAN SECTION WITH BILATERAL TUBAL LIGATION;  Surgeon: 06/14/2017, MD;  Location: [facility] ORS;  Service: Obstetrics;;  . DILATION AND CURETTAGE OF UTERUS    . DILATION AND EVACUATION N/A 08/08/2016   Procedure: DILATATION AND EVACUATION;  Surgeon: 08/10/2016, MD;  Location: [facility] ORS;  Service: Gynecology;  Laterality: N/A;  . PILONIDAL CYST EXCISION    . TUBAL LIGATION  06/12/2017  . VACUUM ASSISTED VAGINAL DELIVERY  2012  . WISDOM TOOTH EXTRACTION      Gynecologic History: Patient's last menstrual period was 07/14/2020 (exact date).  Obstetric History: 07/16/2020  Family History:  Family History  Problem Relation Age of Onset  . Heart disease Maternal Grandmother   . Hypertension Paternal Grandmother   . Migraines Paternal Grandmother   . Neurologic Disorder Mother     Social History:  Social History   Socioeconomic History  . Marital status: Married    Spouse name: Not on file  . Number of children: Not on file  . Years of education: Not on file  . Highest education level: Not on file  Occupational History  . Not on file  Tobacco Use  . Smoking status: Never Smoker  . Smokeless tobacco: Never Used  Vaping Use  . Vaping Use: Never used  Substance and Sexual Activity  . Alcohol use: Yes    Comment: occassional when not pregnant  . Drug use: No  . Sexual activity: Yes    Birth control/protection: Surgical    Comment: BTL  Other Topics  Concern  . Not on file  Social History Narrative  . Not on file   Social Determinants of Health   Financial Resource Strain:   . Difficulty of Paying Living Expenses: Not on file  Food Insecurity:   . Worried About Programme researcher, broadcasting/film/video in the Last Year: Not on file  . Ran Out of Food in the Last Year: Not on file  Transportation Needs:   . Lack of Transportation (Medical): Not on file  . Lack of Transportation (Non-Medical): Not on file    Physical Activity:   . Days of Exercise per Week: Not on file  . Minutes of Exercise per Session: Not on file  Stress:   . Feeling of Stress : Not on file  Social Connections:   . Frequency of Communication with Friends and Family: Not on file  . Frequency of Social Gatherings with Friends and Family: Not on file  . Attends Religious Services: Not on file  . Active Member of Clubs or Organizations: Not on file  . Attends Banker Meetings: Not on file  . Marital Status: Not on file  Intimate Partner Violence:   . Fear of Current or Ex-Partner: Not on file  . Emotionally Abused: Not on file  . Physically Abused: Not on file  . Sexually Abused: Not on file    Allergies:  Allergies  Allergen Reactions  . Prozac [Fluoxetine Hcl] Itching  . Demerol [Meperidine] Nausea And Vomiting    Medications: Prior to Admission medications   Medication Sig Start Date End Date Taking? Authorizing Provider  busPIRone (BUSPAR) 7.5 MG tablet Take 1 tablet (7.5 mg total) by mouth 2 (two) times daily. 07/16/20  Yes [name], MD  citalopram (CELEXA) 40 MG tablet TAKE 1 TABLET BY MOUTH EVERY DAY 09/17/19  Yes [name], MD  hydrOXYzine (VISTARIL) 25 MG capsule Take 1 capsule (25 mg total) by mouth every 6 (six) hours as needed for anxiety. 07/16/20  Yes [name], MD  mometasone (ELOCON) 0.1 % ointment Apply 1 application topically daily as needed (dry skin).   Yes [provider]    Physical Exam Vitals: There were no vitals filed for this visit. Patient's last menstrual period was 07/14/2020 (exact date).  No physical exam as this was a remote telephone visit to promote social distancing during the current COVID-19 Pandemic   GAD 7 : Generalized Anxiety Score 08/06/2020 07/16/2020  Nervous, Anxious, on Edge 1 1  Control/stop worrying 1 1  Worry too much - different things 1 1  Trouble relaxing 0 0  Restless 0 0  Easily annoyed or irritable 2 3   Afraid - awful might happen 1 0  Total GAD 7 Score 6 6  Anxiety Difficulty Somewhat difficult Somewhat difficult    Depression screen [facility] 2/9 08/06/2020 07/16/2020  Decreased Interest 1 2  Down, Depressed, Hopeless 1 1  PHQ - 2 Score 2 3  Altered sleeping 2 0  Tired, decreased energy 2 3  Change in appetite 0 0  Feeling bad or failure about yourself  1 1  Trouble concentrating 0 0  Moving slowly or fidgety/restless 0 0  Suicidal thoughts 0 0  PHQ-9 Score 7 7  Difficult doing work/chores Somewhat difficult Somewhat difficult    Depression screen [facility] 2/9 08/06/2020 07/16/2020  Decreased Interest 1 2  Down, Depressed, Hopeless 1 1  PHQ - 2 Score 2 3  Altered sleeping 2 0  Tired, decreased energy 2 3  Change in  appetite 0 0  Feeling bad or failure about yourself  1 1  Trouble concentrating 0 0  Moving slowly or fidgety/restless 0 0  Suicidal thoughts 0 0  PHQ-9 Score 7 7  Difficult doing work/chores Somewhat difficult Somewhat difficult     Assessment: 44 y.o. [id number] follow up anxiety and depression  Plan: Problem List Items Addressed This Visit    None    Visit Diagnoses    Anxiety and depression    -  Primary   Relevant Medications   busPIRone (BUSPAR) 15 MG tablet      1) Anxiety depression - some improvement in symptoms but scale stable.  No side-effects noted by patient after addition of buspar.   - continue citalopram 40mg  daily - increase buspar to 15mg  po bid from 7.5mg  po bid - continue prn hydroxyzine   2) Thyroid and B12 screen has not been obtained previously  3) Telephone time 6:87min  4) Return in about 4 weeks (around 09/03/2020) for medication follow (phone).    60m, MD, 09/05/2020 [facility], [facility] 08/06/2020, 10:14 AM

## 2020-08-10 DIAGNOSIS — F331 Major depressive disorder, recurrent, moderate: Secondary | ICD-10-CM | POA: Diagnosis not present

## 2020-08-14 ENCOUNTER — Other Ambulatory Visit: Payer: Self-pay | Admitting: Obstetrics and Gynecology

## 2020-08-16 NOTE — Telephone Encounter (Signed)
Please advise 

## 2020-08-17 DIAGNOSIS — F331 Major depressive disorder, recurrent, moderate: Secondary | ICD-10-CM | POA: Diagnosis not present

## 2020-08-24 DIAGNOSIS — F331 Major depressive disorder, recurrent, moderate: Secondary | ICD-10-CM | POA: Diagnosis not present

## 2020-08-27 ENCOUNTER — Other Ambulatory Visit: Payer: Self-pay

## 2020-08-27 ENCOUNTER — Ambulatory Visit (INDEPENDENT_AMBULATORY_CARE_PROVIDER_SITE_OTHER): Payer: BC Managed Care – PPO | Admitting: Obstetrics and Gynecology

## 2020-08-27 VITALS — BP 122/74 | Ht 63.0 in | Wt 145.0 lb

## 2020-08-27 DIAGNOSIS — Z1239 Encounter for other screening for malignant neoplasm of breast: Secondary | ICD-10-CM | POA: Diagnosis not present

## 2020-08-27 DIAGNOSIS — Z01419 Encounter for gynecological examination (general) (routine) without abnormal findings: Secondary | ICD-10-CM

## 2020-08-27 MED ORDER — BUSPIRONE HCL 15 MG PO TABS
15.0000 mg | ORAL_TABLET | Freq: Two times a day (BID) | ORAL | 1 refills | Status: DC
Start: 2020-08-27 — End: 2021-01-05

## 2020-08-27 MED ORDER — CITALOPRAM HYDROBROMIDE 40 MG PO TABS
40.0000 mg | ORAL_TABLET | Freq: Every day | ORAL | 4 refills | Status: DC
Start: 2020-08-27 — End: 2021-01-28

## 2020-08-27 MED ORDER — TRAZODONE HCL 50 MG PO TABS: 50.0000 mg | ORAL_TABLET | Freq: Every day | ORAL | 11 refills | Status: DC

## 2020-08-27 MED ORDER — HYDROXYZINE PAMOATE 25 MG PO CAPS: 25.0000 mg | ORAL_CAPSULE | Freq: Four times a day (QID) | ORAL | 2 refills | Status: DC | PRN

## 2020-08-27 NOTE — Patient Instructions (Signed)
[facility] [address] [place] [address]  [facility] [place]  [address] [place]  [address]  Phone: [phone]  

## 2020-08-27 NOTE — Progress Notes (Signed)
Gynecology Annual Exam  PCP: Jerl Mina, MD  Chief Complaint:  Chief Complaint  Patient presents with  . Annual Exam    History of Present Illness: Patient is a 44 y.o. [id number] presents for annual exam. The patient has no complaints today.   LMP: Patient's last menstrual period was 08/12/2020. Average Interval: regular, no menstrual concerns  The patient is sexually active. She currently uses tubal ligation for contraception. She denies dyspareunia.  The patient does perform self breast exams.  There is no notable family history of breast or ovarian cancer in her family.  The patient wears seatbelts: yes.   The patient has regular exercise: not asked.    The patient reports current symptoms of depression, still well controlled on current regimen but noting some problems staying asleep at night.    Review of Systems: Review of Systems  Constitutional: Negative for chills and fever.  HENT: Negative for congestion.   Respiratory: Negative for cough and shortness of breath.   Cardiovascular: Negative for chest pain and palpitations.  Gastrointestinal: Negative for abdominal pain, constipation, diarrhea, heartburn, nausea and vomiting.  Genitourinary: Negative for dysuria, frequency and urgency.  Skin: Negative for itching and rash.  Neurological: Negative for dizziness and headaches.  Endo/Heme/Allergies: Negative for polydipsia.  Psychiatric/Behavioral: Negative for depression. The patient is nervous/anxious and has insomnia.     Past Medical History:  Patient Active Problem List   Diagnosis Date Noted  . Indication for care or intervention in labor or delivery 06/12/2017  . S/P cesarean section 06/12/2017  . History of cesarean section 06/08/2017  . Fourth degree perineal laceration affecting delivery 02/22/2017  . Elderly multigravida in third trimester 02/20/2017  . Rh negative, antepartum, third trimester 02/20/2017  . Pregnancy, supervision, high-risk, third  trimester 02/20/2017    Clinic [facility]  Dating L=7  Genetic Screen AFP: neg  NIPS: diploid XY  Anatomic [place] complete  GTT Third trimester:   Rhogam 03/21/17  TDaP vaccine 03/21/17            Flu Shot: declined  Baby Food                                 Contraception   CBB    CS/VBAC   +anti-D abs Received rhogam, too week to titer 1st trrimest, repeat second (neg)  h/o 4th degree lac Scheduled c-section with AMS (had CS with G2)  Desires pp BTL Has changed mind.       Prenatal Labs  Blood type: --/--/A NEG (08/22 0815)   Antibody:NEG (08/22 0815)  Rubella:  Immune Varicella: Immune  RPR:   NR  HBsAg:   neg  HIV:   neg  GBS: Positive  Pap:              Past Surgical History:  Past Surgical History:  Procedure Laterality Date  . CESAREAN SECTION  2015  . CESAREAN SECTION WITH BILATERAL TUBAL LIGATION  06/12/2017   Procedure: CESAREAN SECTION WITH BILATERAL TUBAL LIGATION;  Surgeon: [name], MD;  Location: [facility] ORS;  Service: Obstetrics;;  . DILATION AND CURETTAGE OF UTERUS    . DILATION AND EVACUATION N/A 08/08/2016   Procedure: DILATATION AND EVACUATION;  Surgeon: [name], MD;  Location: [facility] ORS;  Service: Gynecology;  Laterality: N/A;  . PILONIDAL CYST EXCISION    . TUBAL LIGATION  06/12/2017  . VACUUM ASSISTED VAGINAL DELIVERY  2012  . WISDOM  TOOTH EXTRACTION      Gynecologic History:  Patient's last menstrual period was 08/12/2020. Contraception: tubal ligation Last Pap: Results were: 08/20/2018 NIL and HR HPV negative  Last mammogram: 09/20/2019 Results were: BI-RAD I  Obstetric History: [id number]  Family History:  Family History  Problem Relation Age of Onset  . Heart disease Maternal Grandmother   . Hypertension Paternal Grandmother   . Migraines Paternal Grandmother   . Neurologic Disorder Mother     Social History:  Social History   Socioeconomic History  . Marital status: Married    Spouse name: Not on file  . Number of  children: Not on file  . Years of education: Not on file  . Highest education level: Not on file  Occupational History  . Not on file  Tobacco Use  . Smoking status: Never Smoker  . Smokeless tobacco: Never Used  Vaping Use  . Vaping Use: Never used  Substance and Sexual Activity  . Alcohol use: Yes    Comment: occassional when not pregnant  . Drug use: No  . Sexual activity: Yes    Birth control/protection: Surgical    Comment: BTL  Other Topics Concern  . Not on file  Social History Narrative  . Not on file   Social Determinants of Health   Financial Resource Strain:   . Difficulty of Paying Living Expenses: Not on file  Food Insecurity:   . Worried About Programme researcher, broadcasting/film/video in the Last Year: Not on file  . Ran Out of Food in the Last Year: Not on file  Transportation Needs:   . Lack of Transportation (Medical): Not on file  . Lack of Transportation (Non-Medical): Not on file  Physical Activity:   . Days of Exercise per Week: Not on file  . Minutes of Exercise per Session: Not on file  Stress:   . Feeling of Stress : Not on file  Social Connections:   . Frequency of Communication with Friends and Family: Not on file  . Frequency of Social Gatherings with Friends and Family: Not on file  . Attends Religious Services: Not on file  . Active Member of Clubs or Organizations: Not on file  . Attends Banker Meetings: Not on file  . Marital Status: Not on file  Intimate Partner Violence:   . Fear of Current or Ex-Partner: Not on file  . Emotionally Abused: Not on file  . Physically Abused: Not on file  . Sexually Abused: Not on file    Allergies:  Allergies  Allergen Reactions  . Prozac [Fluoxetine Hcl] Itching  . Demerol [Meperidine] Nausea And Vomiting    Medications: Prior to Admission medications   Medication Sig Start Date End Date Taking? Authorizing Provider  busPIRone (BUSPAR) 15 MG tablet TAKE 1 TABLET (15 MG TOTAL) BY MOUTH 2 (TWO) TIMES  DAILY. 08/16/20  Yes [name], MD  citalopram (CELEXA) 40 MG tablet TAKE 1 TABLET BY MOUTH EVERY DAY 09/17/19  Yes [name], MD  hydrOXYzine (VISTARIL) 25 MG capsule Take 1 capsule (25 mg total) by mouth every 6 (six) hours as needed for anxiety. 07/16/20  Yes [name], MD  mometasone (ELOCON) 0.1 % ointment Apply 1 application topically daily as needed (dry skin).   Yes [provider]    Physical Exam Vitals: Blood pressure 122/74, height 5\' 3"  (1.6 m), weight 145 lb (65.8 kg), last menstrual period 08/12/2020, currently breastfeeding.  General: NAD HEENT: normocephalic, anicteric Thyroid: no enlargement, no palpable  nodules Pulmonary: No increased work of breathing, CTAB Cardiovascular: RRR, distal pulses 2+ Breast: Breast symmetrical, no tenderness, no palpable nodules or masses, no skin or nipple retraction present, no nipple discharge.  No axillary or supraclavicular lymphadenopathy. Abdomen: NABS, soft, non-tender, non-distended.  Umbilicus without lesions.  No hepatomegaly, splenomegaly or masses palpable. No evidence of hernia  Genitourinary:  External: Normal external female genitalia.  Normal urethral meatus, normal Bartholin's and Skene's glands.    Vagina: Normal vaginal mucosa, no evidence of prolapse.    Cervix: Grossly normal in appearance, no bleeding  Uterus: Non-enlarged, mobile, normal contour.  No CMT  Adnexa: ovaries non-enlarged, no adnexal masses  Rectal: deferred  Lymphatic: no evidence of inguinal lymphadenopathy Extremities: no edema, erythema, or tenderness Neurologic: Grossly intact Psychiatric: mood appropriate, affect full  Female chaperone present for pelvic and breast  portions of the physical exam  Immunization History  Administered Date(s) Administered  . Influenza,inj,Quad PF,6+ Mos 08/26/2019  . Influenza-Unspecified 08/07/2020    Assessment: 44 y.o. [id number] routine annual exam  Plan: Problem List Items  Addressed This Visit    None    Visit Diagnoses    Encounter for gynecological examination without abnormal finding    -  Primary   Breast screening       Relevant Orders   MM 3D SCREEN BREAST BILATERAL      1) Mammogram - recommend yearly screening mammogram.  Mammogram Was ordered today   2) STI screening  was notoffered and therefore not obtained  3) ASCCP guidelines and rational discussed.  Patient opts for every 3 years screening interval  4) Contraception - the patient is currently using  tubal ligation.  She is happy with her current form of contraception and plans to continue  5) Colonoscopy -- Screening recommended starting at age 15 for average risk individuals, age 30 for individuals deemed at increased risk (including African Americans) and recommended to continue until age 49.  For patient age 41-85 individualized approach is recommended.  Gold standard screening is via colonoscopy, Cologuard screening is an acceptable alternative for patient unwilling or unable to undergo colonoscopy.  "Colorectal cancer screening for average?risk adults: 2018 guideline update from the American Cancer Society"CA: A Cancer Journal for Clinicians: May 16, 2017   6) Routine healthcare maintenance including cholesterol, diabetes screening discussed managed by PCP  7) Anxiety/Depression continue citalopram 40mg  daily and busapr 15mg  bid with prn vistaril.  Add prn trazedone  8) Return in about 8 weeks (around 10/22/2020) for medication follow up.   , MD, 13/04/2020 [facility], [facility] 08/27/2020, 10:03 AM

## 2020-08-31 DIAGNOSIS — F331 Major depressive disorder, recurrent, moderate: Secondary | ICD-10-CM | POA: Diagnosis not present

## 2020-09-01 ENCOUNTER — Telehealth: Payer: Self-pay

## 2020-09-01 NOTE — Telephone Encounter (Signed)
Pt aware.

## 2020-09-01 NOTE — Telephone Encounter (Signed)
Pt calling; has question about daily rxs; sometimes can't remember if she has taken them or not.  Buspar 15mg  bid and citalopram 40mg  qam.  Would it be better to take it and possibly double up or just be sure to take it the next day?  [phone]

## 2020-09-01 NOTE — Telephone Encounter (Signed)
Wait and start back next dose, do not double up

## 2020-09-07 DIAGNOSIS — F331 Major depressive disorder, recurrent, moderate: Secondary | ICD-10-CM | POA: Diagnosis not present

## 2020-09-19 ENCOUNTER — Other Ambulatory Visit: Payer: Self-pay | Admitting: Obstetrics and Gynecology

## 2020-09-21 DIAGNOSIS — F331 Major depressive disorder, recurrent, moderate: Secondary | ICD-10-CM | POA: Diagnosis not present

## 2020-09-28 DIAGNOSIS — F331 Major depressive disorder, recurrent, moderate: Secondary | ICD-10-CM | POA: Diagnosis not present

## 2020-10-05 DIAGNOSIS — F331 Major depressive disorder, recurrent, moderate: Secondary | ICD-10-CM | POA: Diagnosis not present

## 2020-10-12 DIAGNOSIS — F331 Major depressive disorder, recurrent, moderate: Secondary | ICD-10-CM | POA: Diagnosis not present

## 2020-10-21 ENCOUNTER — Ambulatory Visit
Admission: RE | Admit: 2020-10-21 | Discharge: 2020-10-21 | Disposition: A | Discharge status: Home / Self Care | Payer: BC Managed Care – PPO | Source: Ambulatory Visit | Attending: Obstetrics and Gynecology | Admitting: Obstetrics and Gynecology

## 2020-10-21 ENCOUNTER — Other Ambulatory Visit: Payer: Self-pay

## 2020-10-21 DIAGNOSIS — Z1231 Encounter for screening mammogram for malignant neoplasm of breast: Secondary | ICD-10-CM | POA: Diagnosis not present

## 2020-10-21 DIAGNOSIS — Z1239 Encounter for other screening for malignant neoplasm of breast: Secondary | ICD-10-CM

## 2020-10-25 ENCOUNTER — Other Ambulatory Visit: Payer: Self-pay

## 2020-10-25 ENCOUNTER — Ambulatory Visit (INDEPENDENT_AMBULATORY_CARE_PROVIDER_SITE_OTHER): Payer: BC Managed Care – PPO | Admitting: Obstetrics and Gynecology

## 2020-10-25 VITALS — Wt 139.0 lb

## 2020-10-25 DIAGNOSIS — F32A Depression, unspecified: Secondary | ICD-10-CM

## 2020-10-25 DIAGNOSIS — F419 Anxiety disorder, unspecified: Secondary | ICD-10-CM | POA: Diagnosis not present

## 2020-10-25 NOTE — Progress Notes (Signed)
TELEPHONE visit medication follow up

## 2020-10-25 NOTE — Progress Notes (Signed)
I connected with Adrienne Richards on 10/25/20 at 11:10 AM EST by telephone and verified that I am speaking with the correct person using two identifiers.   I discussed the limitations, risks, security and privacy concerns of performing an evaluation and management service by telephone and the availability of in person appointments. I also discussed with the patient that there may be a patient responsible charge related to this service. The patient expressed understanding and agreed to proceed.  The patient was at home I spoke with the patient from my workstation phone The names of people involved in this encounter were: Adrienne Richards , and Adrienne Richards    Obstetrics & Gynecology Office Visit   Chief Complaint:  Chief Complaint  Patient presents with  . Gynecologic Exam    History of Present Illness: The patient is a 44 y.o. female presenting follow up for symptoms of anxiety and depression.  The patient is currently taking Celexa 40mg  po daily, buspar 15mg  bid, and trazodone 50mg  vistaril 25mg  po prn for the management of her symptoms.  She has not had any recent situational stressors.  She reports good improvement in symptoms.  She denies anhedonia, day time somnolence, insomnia, risk taking behavior, irritability, social anxiety, agorophobia, feelings of guilt, feelings of worthlessness, suicidal ideation, homicidal ideation, auditory hallucinations and visual hallucinations. Symptoms have improved since last visit.     The patient does have a pre-existing history of depression and anxiety.  She  does not a prior history of suicide attempts.    Review of Systems: Review of Systems  Constitutional: Negative.   Gastrointestinal: Negative for nausea.  Neurological: Negative for headaches.  Psychiatric/Behavioral: Negative.      Past Medical History:  Past Medical History:  Diagnosis Date  . Depression     Past Surgical History:  Past Surgical History:  Procedure Laterality  Date  . CESAREAN SECTION  2015  . CESAREAN SECTION WITH BILATERAL TUBAL LIGATION  06/12/2017   Procedure: CESAREAN SECTION WITH BILATERAL TUBAL LIGATION;  Surgeon: , MD;  Location: [facility] ORS;  Service: Obstetrics;;  . DILATION AND CURETTAGE OF UTERUS    . DILATION AND EVACUATION N/A 08/08/2016   Procedure: DILATATION AND EVACUATION;  Surgeon: , MD;  Location: [facility] ORS;  Service: Gynecology;  Laterality: N/A;  . PILONIDAL CYST EXCISION    . TUBAL LIGATION  06/12/2017  . VACUUM ASSISTED VAGINAL DELIVERY  2012  . WISDOM TOOTH EXTRACTION      Gynecologic History: Patient's last menstrual period was 10/01/2020.  Obstetric History: Adrienne Richards  Family History:  Family History  Problem Relation Age of Onset  . Heart disease Maternal Grandmother   . Hypertension Paternal Grandmother   . Migraines Paternal Grandmother   . Neurologic Disorder Mother     Social History:  Social History   Socioeconomic History  . Marital status: Married    Spouse name: Not on file  . Number of children: Not on file  . Years of education: Not on file  . Highest education level: Not on file  Occupational History  . Not on file  Tobacco Use  . Smoking status: Never Smoker  . Smokeless tobacco: Never Used  Vaping Use  . Vaping Use: Never used  Substance and Sexual Activity  . Alcohol use: Yes    Comment: occassional when not pregnant  . Drug use: No  . Sexual activity: Yes    Birth control/protection: Surgical    Comment: BTL  Other Topics Concern  .  Not on file  Social History Narrative  . Not on file   Social Determinants of Health   Financial Resource Strain:   . Difficulty of Paying Living Expenses: Not on file  Food Insecurity:   . Worried About Programme researcher, broadcasting/film/video in the Last Year: Not on file  . Ran Out of Food in the Last Year: Not on file  Transportation Needs:   . Lack of Transportation (Medical): Not on file  . Lack of Transportation (Non-Medical):  Not on file  Physical Activity:   . Days of Exercise per Week: Not on file  . Minutes of Exercise per Session: Not on file  Stress:   . Feeling of Stress : Not on file  Social Connections:   . Frequency of Communication with Friends and Family: Not on file  . Frequency of Social Gatherings with Friends and Family: Not on file  . Attends Religious Services: Not on file  . Active Member of Clubs or Organizations: Not on file  . Attends Banker Meetings: Not on file  . Marital Status: Not on file  Intimate Partner Violence:   . Fear of Current or Ex-Partner: Not on file  . Emotionally Abused: Not on file  . Physically Abused: Not on file  . Sexually Abused: Not on file    Allergies:  Allergies  Allergen Reactions  . Prozac [Fluoxetine Hcl] Itching  . Demerol [Meperidine] Nausea And Vomiting    Medications: Prior to Admission medications   Medication Sig Start Date End Date Taking? Authorizing Provider  busPIRone (BUSPAR) 15 MG tablet Take 1 tablet (15 mg total) by mouth 2 (two) times daily. 08/27/20   Adrienne Austria, MD  citalopram (CELEXA) 40 MG tablet Take 1 tablet (40 mg total) by mouth daily. 08/27/20   Adrienne Austria, MD  hydrOXYzine (VISTARIL) 25 MG capsule Take 1 capsule (25 mg total) by mouth every 6 (six) hours as needed for anxiety. 08/27/20   Adrienne Austria, MD  mometasone (ELOCON) 0.1 % ointment Apply 1 application topically daily as needed (dry skin).    [provider]  traZODone (DESYREL) 50 MG tablet TAKE 1 TABLET BY MOUTH EVERYDAY AT BEDTIME 09/20/20   Adrienne Austria, MD    Physical Exam Vitals: There were no vitals filed for this visit. Patient's last menstrual period was 10/01/2020.  No physical exam as this was a remote telephone visit to promote social distancing during the current COVID-19 Pandemic   GAD 7 : Generalized Anxiety Score 10/25/2020 08/06/2020 07/16/2020  Nervous, Anxious, on Edge 0 1 1  Control/stop worrying 0 1  1  Worry too much - different things 0 1 1  Trouble relaxing 0 0 0  Restless 0 0 0  Easily annoyed or irritable 0 2 3  Afraid - awful might happen 0 1 0  Total GAD 7 Score 0 6 6  Anxiety Difficulty - Somewhat difficult Somewhat difficult    Depression screen [facility] 2/9 10/25/2020 08/06/2020 07/16/2020  Decreased Interest 0 1 2  Down, Depressed, Hopeless 0 1 1  PHQ - 2 Score 0 2 3  Altered sleeping 0 2 0  Tired, decreased energy 0 2 3  Change in appetite 0 0 0  Feeling bad or failure about yourself  0 1 1  Trouble concentrating 0 0 0  Moving slowly or fidgety/restless 0 0 0  Suicidal thoughts 0 0 0  PHQ-9 Score 0 7 7  Difficult doing work/chores - Somewhat difficult Somewhat difficult  Depression screen [facility] 2/9 10/25/2020 08/06/2020 07/16/2020  Decreased Interest 0 1 2  Down, Depressed, Hopeless 0 1 1  PHQ - 2 Score 0 2 3  Altered sleeping 0 2 0  Tired, decreased energy 0 2 3  Change in appetite 0 0 0  Feeling bad or failure about yourself  0 1 1  Trouble concentrating 0 0 0  Moving slowly or fidgety/restless 0 0 0  Suicidal thoughts 0 0 0  PHQ-9 Score 0 7 7  Difficult doing work/chores - Somewhat difficult Somewhat difficult     Assessment: 44 y.o. [id number] follow up anxiety and depression  Plan: Problem List Items Addressed This Visit    None    Visit Diagnoses    Anxiety and depression    -  Primary      1) Anxiety/Depression - continue celexa 40mg , buspar 15mg  bid, and trazodone 50mg     2) Thyroid and B12 screen has not been obtained previously  3) Telephone Time 17:52 min   4) Ablation - patient interested in possible ablation.  The patient was counseled on the overall effectiveness of endometrial ablation in achieving amenorrhea.  She is aware that some patient may continue to have menstrual cycles although these are generally greatly reduced in flow.  In addition she was quoted a failure rate for endometrial ablation of approximately 25% within the frist 4  years, but these failures may happen at any time during or after the initial 4 year postop period.  She is aware that pregnancy is contra-indicated in the setting of prior endometrial ablation, and that ablation itself does not confer any contraceptive benefit.  She will therefore need to continue to rely on some means of contraception following the procedure.  Although rare and generally confined to patient who have undergone prior tubal ligatoin, post-ablation tubal sterilizaton syndrome (PATSS) may also occur with no reliable incidence rates as the majority of published literature is limited to case reports.   Prior to being considered a candidate for Novasure ablation she will need to undergo endometrial biopsy to rule out endometrial hyperplasia or malignancy as the cause of her bleeding, have an up to date pap on record, and undergo transvaginal ultrasound to verify the absence of focal endometrial lesion which may need to be addressed prior to proceeding with ablation.  In addition she is aware that the device is limited for use in women with a normal uterine cavity and uterine leiomyomata <3cm in size.  If present leiomyomata may increase the long-term failure rate of the procedure.  In rare instances the presence of a uterine septum or arcuate uterus, which may not be readily apparent on preoperative ultrasound, may necessitate the procedure to be aborted.    5) Return in about 3 months (around 01/25/2021) for medication follow up.    , MD, OB/GYN, [facility] 10/25/2020, 12:34 PM

## 2020-10-26 DIAGNOSIS — F331 Major depressive disorder, recurrent, moderate: Secondary | ICD-10-CM | POA: Diagnosis not present

## 2020-11-02 DIAGNOSIS — F331 Major depressive disorder, recurrent, moderate: Secondary | ICD-10-CM | POA: Diagnosis not present

## 2020-11-16 DIAGNOSIS — F331 Major depressive disorder, recurrent, moderate: Secondary | ICD-10-CM | POA: Diagnosis not present

## 2020-11-30 DIAGNOSIS — F331 Major depressive disorder, recurrent, moderate: Secondary | ICD-10-CM | POA: Diagnosis not present

## 2020-12-28 DIAGNOSIS — F331 Major depressive disorder, recurrent, moderate: Secondary | ICD-10-CM | POA: Diagnosis not present

## 2021-01-04 ENCOUNTER — Other Ambulatory Visit: Payer: Self-pay | Admitting: Obstetrics and Gynecology

## 2021-01-28 ENCOUNTER — Ambulatory Visit (INDEPENDENT_AMBULATORY_CARE_PROVIDER_SITE_OTHER): Payer: BC Managed Care – PPO | Admitting: Obstetrics and Gynecology

## 2021-01-28 ENCOUNTER — Encounter: Payer: Self-pay | Admitting: Obstetrics and Gynecology

## 2021-01-28 DIAGNOSIS — F32A Depression, unspecified: Secondary | ICD-10-CM

## 2021-01-28 DIAGNOSIS — F419 Anxiety disorder, unspecified: Secondary | ICD-10-CM

## 2021-01-28 MED ORDER — MOMETASONE FUROATE 0.1 % EX OINT: 1.0000 "application " | TOPICAL_OINTMENT | Freq: Every day | CUTANEOUS | 2 refills | Status: AC | PRN

## 2021-01-28 MED ORDER — HYDROXYZINE PAMOATE 25 MG PO CAPS: 25.0000 mg | ORAL_CAPSULE | Freq: Four times a day (QID) | ORAL | 2 refills | Status: AC | PRN

## 2021-01-28 MED ORDER — CITALOPRAM HYDROBROMIDE 40 MG PO TABS
40.0000 mg | ORAL_TABLET | Freq: Every day | ORAL | 4 refills | Status: DC
Start: 2021-01-28 — End: 2022-04-13

## 2021-01-28 MED ORDER — BUSPIRONE HCL 15 MG PO TABS
15.0000 mg | ORAL_TABLET | Freq: Two times a day (BID) | ORAL | 1 refills | Status: DC
Start: 2021-01-28 — End: 2021-10-20

## 2021-01-28 NOTE — Progress Notes (Signed)
I connected with Adrienne Richards on 01/28/21 at  9:10 AM EST by telephone and verified that I am speaking with the correct person using two identifiers.   I discussed the limitations, risks, security and privacy concerns of performing an evaluation and management service by telephone and the availability of in person appointments. I also discussed with the patient that there may be a patient responsible charge related to this service. The patient expressed understanding and agreed to proceed.  The patient was at home I spoke with the patient from my workstation phone The names of people involved in this encounter were: [name] , and [name]   Obstetrics & Gynecology Office Visit   Chief Complaint:  Chief Complaint  Patient presents with  . Follow-up    Phone visit F/U medication, Refill mometasone 0.1% ointment, no concerns    History of Present Illness: The patient is a 45 y.o. female presenting follow up for symptoms of anxiety and depression.  The patient is currently taking citalopram 40mg  po daily and buspar 15mg  po bid for the management of her symptoms.  In addition she is in individual and couple counseling which she feels has been helpful. She has not had any recent situational stressors.  She reports continued good control of symptoms on her current regimen.  She denies anhedonia, day time somnolence, risk taking behavior, irritability, social anxiety, agorophobia, feelings of guilt, feelings of worthlessness, suicidal ideation, homicidal ideation, auditory hallucinations and visual hallucinations. Symptoms have improved since last visit.     The patient does have a pre-existing history of depression and anxiety.  She  does not a prior history of suicide attempts.    Review of Systems:  Review of Systems  Constitutional: Negative.   Gastrointestinal: Negative for nausea.  Neurological: Negative for headaches.  Psychiatric/Behavioral: Negative.      Past  Medical History:  Past Medical History:  Diagnosis Date  . Depression     Past Surgical History:  Past Surgical History:  Procedure Laterality Date  . CESAREAN SECTION  2015  . CESAREAN SECTION WITH BILATERAL TUBAL LIGATION  06/12/2017   Procedure: CESAREAN SECTION WITH BILATERAL TUBAL LIGATION;  Surgeon: , MD;  Location: [facility] ORS;  Service: Obstetrics;;  . DILATION AND CURETTAGE OF UTERUS    . DILATION AND EVACUATION N/A 08/08/2016   Procedure: DILATATION AND EVACUATION;  Surgeon: [name], MD;  Location: [facility] ORS;  Service: Gynecology;  Laterality: N/A;  . PILONIDAL CYST EXCISION    . TUBAL LIGATION  06/12/2017  . VACUUM ASSISTED VAGINAL DELIVERY  2012  . WISDOM TOOTH EXTRACTION      Gynecologic History: No LMP recorded.  Obstetric History: 06/14/2017  Family History:  Family History  Problem Relation Age of Onset  . Heart disease Maternal Grandmother   . Hypertension Paternal Grandmother   . Migraines Paternal Grandmother   . Neurologic Disorder Mother     Social History:  Social History   Socioeconomic History  . Marital status: Married    Spouse name: Not on file  . Number of children: Not on file  . Years of education: Not on file  . Highest education level: Not on file  Occupational History  . Not on file  Tobacco Use  . Smoking status: Never Smoker  . Smokeless tobacco: Never Used  Vaping Use  . Vaping Use: Never used  Substance and Sexual Activity  . Alcohol use: Yes    Comment: occassional when not pregnant  .  Drug use: No  . Sexual activity: Yes    Birth control/protection: Surgical    Comment: BTL  Other Topics Concern  . Not on file  Social History Narrative  . Not on file   Social Determinants of Health   Financial Resource Strain: Not on file  Food Insecurity: Not on file  Transportation Needs: Not on file  Physical Activity: Not on file  Stress: Not on file  Social Connections: Not on file  Intimate Partner  Violence: Not on file    Allergies:  Allergies  Allergen Reactions  . Prozac [Fluoxetine Hcl] Itching  . Demerol [Meperidine] Nausea And Vomiting    Medications: Prior to Admission medications   Medication Sig Start Date End Date Taking? Authorizing Provider  busPIRone (BUSPAR) 15 MG tablet TAKE 1 TABLET (15 MG TOTAL) BY MOUTH 2 (TWO) TIMES DAILY. 01/05/21  Yes [name], MD  citalopram (CELEXA) 40 MG tablet Take 1 tablet (40 mg total) by mouth daily. 08/27/20  Yes [name], MD  hydrOXYzine (VISTARIL) 25 MG capsule Take 1 capsule (25 mg total) by mouth every 6 (six) hours as needed for anxiety. 08/27/20  Yes [name], MD  mometasone (ELOCON) 0.1 % ointment Apply 1 application topically daily as needed (dry skin).   Yes [provider]  traZODone (DESYREL) 50 MG tablet TAKE 1 TABLET BY MOUTH EVERYDAY AT BEDTIME 09/20/20  Yes [name], MD    Physical Exam Vitals: There were no vitals filed for this visit. No LMP recorded.  No physical exam as this was a remote telephone visit to promote social distancing during the current COVID-19 Pandemic   GAD 7 : Generalized Anxiety Score 01/28/2021 10/25/2020 08/06/2020 07/16/2020  Nervous, Anxious, on Edge 1 0 1 1  Control/stop worrying 0 0 1 1  Worry too much - different things 0 0 1 1  Trouble relaxing 0 0 0 0  Restless 0 0 0 0  Easily annoyed or irritable 1 0 2 3  Afraid - awful might happen 0 0 1 0  Total GAD 7 Score 2 0 6 6  Anxiety Difficulty Not difficult at all - Somewhat difficult Somewhat difficult    Depression screen [facility] 2/9 01/28/2021 10/25/2020 08/06/2020  Decreased Interest 1 0 1  Down, Depressed, Hopeless 0 0 1  PHQ - 2 Score 1 0 2  Altered sleeping 0 0 2  Tired, decreased energy 0 0 2  Change in appetite 0 0 0  Feeling bad or failure about yourself  0 0 1  Trouble concentrating 0 0 0  Moving slowly or fidgety/restless 0 0 0  Suicidal thoughts 0 0 0  PHQ-9 Score 1 0 7  Difficult  doing work/chores Not difficult at all - Somewhat difficult    Depression screen [facility] 2/9 01/28/2021 10/25/2020 08/06/2020 07/16/2020  Decreased Interest 1 0 1 2  Down, Depressed, Hopeless 0 0 1 1  PHQ - 2 Score 1 0 2 3  Altered sleeping 0 0 2 0  Tired, decreased energy 0 0 2 3  Change in appetite 0 0 0 0  Feeling bad or failure about yourself  0 0 1 1  Trouble concentrating 0 0 0 0  Moving slowly or fidgety/restless 0 0 0 0  Suicidal thoughts 0 0 0 0  PHQ-9 Score 1 0 7 7  Difficult doing work/chores Not difficult at all - Somewhat difficult Somewhat difficult     Assessment: 45 y.o. [id number] follow up anxiety and depression  Plan:  Problem List Items Addressed This Visit   None   Visit Diagnoses    Anxiety and depression    -  Primary   Relevant Medications   busPIRone (BUSPAR) 15 MG tablet   citalopram (CELEXA) 40 MG tablet   hydrOXYzine (VISTARIL) 25 MG capsule      1) Anxiety Depression - continue current dose of citalopram and buspar.  Continues with both couples therapy as well as personal therapy.  Had brought up low libido but exploring this further believes she is asexual and has always had low libido and thus not find this distressing herself  2) Thyroid and B12 screen has been obtained previously  3) Telephone time 6:2min  4) Return in about 8 months (around 09/27/2021) for Annual.    [name], MD, [facility], [facility]  01/28/2021, 9:39 AM

## 2021-02-01 DIAGNOSIS — F331 Major depressive disorder, recurrent, moderate: Secondary | ICD-10-CM | POA: Diagnosis not present

## 2021-02-15 DIAGNOSIS — F331 Major depressive disorder, recurrent, moderate: Secondary | ICD-10-CM | POA: Diagnosis not present

## 2021-03-08 DIAGNOSIS — F331 Major depressive disorder, recurrent, moderate: Secondary | ICD-10-CM | POA: Diagnosis not present

## 2021-03-22 DIAGNOSIS — F331 Major depressive disorder, recurrent, moderate: Secondary | ICD-10-CM | POA: Diagnosis not present

## 2021-04-05 DIAGNOSIS — F331 Major depressive disorder, recurrent, moderate: Secondary | ICD-10-CM | POA: Diagnosis not present

## 2021-04-15 ENCOUNTER — Other Ambulatory Visit: Payer: Self-pay | Admitting: Obstetrics and Gynecology

## 2021-04-25 DIAGNOSIS — F331 Major depressive disorder, recurrent, moderate: Secondary | ICD-10-CM | POA: Diagnosis not present

## 2021-05-03 DIAGNOSIS — F331 Major depressive disorder, recurrent, moderate: Secondary | ICD-10-CM | POA: Diagnosis not present

## 2021-05-17 DIAGNOSIS — F331 Major depressive disorder, recurrent, moderate: Secondary | ICD-10-CM | POA: Diagnosis not present

## 2021-06-07 DIAGNOSIS — F331 Major depressive disorder, recurrent, moderate: Secondary | ICD-10-CM | POA: Diagnosis not present

## 2021-07-06 DIAGNOSIS — F331 Major depressive disorder, recurrent, moderate: Secondary | ICD-10-CM | POA: Diagnosis not present

## 2021-08-16 DIAGNOSIS — F331 Major depressive disorder, recurrent, moderate: Secondary | ICD-10-CM | POA: Diagnosis not present

## 2021-09-16 ENCOUNTER — Other Ambulatory Visit: Payer: Self-pay | Admitting: Obstetrics and Gynecology

## 2021-09-16 DIAGNOSIS — Z1231 Encounter for screening mammogram for malignant neoplasm of breast: Secondary | ICD-10-CM

## 2021-09-30 ENCOUNTER — Encounter: Payer: Self-pay | Admitting: Obstetrics and Gynecology

## 2021-09-30 ENCOUNTER — Ambulatory Visit (INDEPENDENT_AMBULATORY_CARE_PROVIDER_SITE_OTHER): Payer: BC Managed Care – PPO | Admitting: Obstetrics and Gynecology

## 2021-09-30 ENCOUNTER — Other Ambulatory Visit (HOSPITAL_COMMUNITY)
Admission: RE | Admit: 2021-09-30 | Discharge: 2021-09-30 | Disposition: A | Discharge status: Home / Self Care | Payer: BC Managed Care – PPO | Source: Ambulatory Visit | Attending: Obstetrics and Gynecology | Admitting: Obstetrics and Gynecology

## 2021-09-30 ENCOUNTER — Other Ambulatory Visit: Payer: Self-pay

## 2021-09-30 VITALS — BP 110/60 | HR 92 | Ht 63.0 in | Wt 150.0 lb

## 2021-09-30 DIAGNOSIS — Z01419 Encounter for gynecological examination (general) (routine) without abnormal findings: Secondary | ICD-10-CM | POA: Diagnosis not present

## 2021-09-30 DIAGNOSIS — Z1239 Encounter for other screening for malignant neoplasm of breast: Secondary | ICD-10-CM | POA: Diagnosis not present

## 2021-09-30 DIAGNOSIS — Z124 Encounter for screening for malignant neoplasm of cervix: Secondary | ICD-10-CM | POA: Diagnosis not present

## 2021-09-30 DIAGNOSIS — Z23 Encounter for immunization: Secondary | ICD-10-CM

## 2021-09-30 DIAGNOSIS — Z1151 Encounter for screening for human papillomavirus (HPV): Secondary | ICD-10-CM | POA: Diagnosis not present

## 2021-09-30 DIAGNOSIS — Z1211 Encounter for screening for malignant neoplasm of colon: Secondary | ICD-10-CM

## 2021-09-30 MED ORDER — SLYND 4 MG PO TABS: 1.0000 | ORAL_TABLET | Freq: Every day | ORAL | 2 refills | Status: DC

## 2021-09-30 NOTE — Progress Notes (Signed)
Gynecology Annual Exam  PCP: Jerl Mina, MD  Chief Complaint:  Chief Complaint  Patient presents with   Gynecologic Exam    Annual - no concerns. RM 5    History of Present Illness: Patient is a 45 y.o. [id number] presents for annual exam. The patient has no complaints today.   LMP: Patient's last menstrual period was 09/14/2021. Average Interval: regular, monthly Duration of flow:  4-5  days Heavy Menses: no Clots: no Intermenstrual Bleeding: no Postcoital Bleeding: no Dysmenorrhea: no   The patient is sexually active. She currently uses tubal ligation for contraception. She denies dyspareunia.  The patient does perform self breast exams.  There is no notable family history of breast or ovarian cancer in her family.  The patient wears seatbelts: yes.   The patient has regular exercise: not asked.    The patient denies current symptoms of depression.    Review of Systems: ROS  Past Medical History:  Patient Active Problem List   Diagnosis Date Noted   Indication for care or intervention in labor or delivery 06/12/2017   S/P cesarean section 06/12/2017   History of cesarean section 06/08/2017   Fourth degree perineal laceration affecting delivery 02/22/2017   Elderly multigravida in third trimester 02/20/2017   Rh negative, antepartum, third trimester 02/20/2017   Pregnancy, supervision, high-risk, third trimester 02/20/2017    Clinic [facility]  Dating L=7  Genetic Screen AFP: neg  NIPS: diploid XY  Anatomic [place] complete  GTT Third trimester:   Rhogam 03/21/17  TDaP vaccine 03/21/17            Flu Shot: declined  Baby Food                                 Contraception   CBB    CS/VBAC   +anti-D abs Received rhogam, too week to titer 1st trrimest, repeat second (neg)  h/o 4th degree lac Scheduled c-section with AMS (had CS with G2)  Desires pp BTL Has changed mind.       Prenatal Labs  Blood type: --/--/A NEG (08/22 0815)   Antibody:NEG (08/22 0815)  Rubella:   Immune Varicella: Immune  RPR:   NR  HBsAg:   neg  HIV:   neg  GBS: Positive  Pap:              Past Surgical History:  Past Surgical History:  Procedure Laterality Date   CESAREAN SECTION  2015   CESAREAN SECTION WITH BILATERAL TUBAL LIGATION  06/12/2017   Procedure: CESAREAN SECTION WITH BILATERAL TUBAL LIGATION;  Surgeon: [name], MD;  Location: [facility] ORS;  Service: Obstetrics;;   DILATION AND CURETTAGE OF UTERUS     DILATION AND EVACUATION N/A 08/08/2016   Procedure: DILATATION AND EVACUATION;  Surgeon: [name], MD;  Location: [facility] ORS;  Service: Gynecology;  Laterality: N/A;   PILONIDAL CYST EXCISION     TUBAL LIGATION  06/12/2017   VACUUM ASSISTED VAGINAL DELIVERY  2012   WISDOM TOOTH EXTRACTION      Gynecologic History:  Patient's last menstrual period was 09/14/2021. Contraception: tubal ligation Last Pap: Results were: 08/20/2018 NIL and HR HPV negative  Last mammogram: 10/21/2020 Results were: Elby Showers I  Obstetric History: [id number]  Family History:  Family History  Problem Relation Age of Onset   Heart disease Maternal Grandmother    Hypertension Paternal Grandmother    Migraines Paternal Grandmother  Neurologic Disorder Mother     Social History:  Social History   Socioeconomic History   Marital status: Married    Spouse name: Not on file   Number of children: Not on file   Years of education: Not on file   Highest education level: Not on file  Occupational History   Not on file  Tobacco Use   Smoking status: Never   Smokeless tobacco: Never  Vaping Use   Vaping Use: Never used  Substance and Sexual Activity   Alcohol use: Yes    Comment: occassional when not pregnant   Drug use: No   Sexual activity: Yes    Birth control/protection: Surgical    Comment: BTL  Other Topics Concern   Not on file  Social History Narrative   Not on file   Social Determinants of Health   Financial Resource Strain: Not on file  Food  Insecurity: Not on file  Transportation Needs: Not on file  Physical Activity: Not on file  Stress: Not on file  Social Connections: Not on file  Intimate Partner Violence: Not on file    Allergies:  Allergies  Allergen Reactions   Prozac [Fluoxetine Hcl] Itching   Demerol [Meperidine] Nausea And Vomiting    Medications: Prior to Admission medications   Medication Sig Start Date End Date Taking? Authorizing Provider  busPIRone (BUSPAR) 15 MG tablet Take 1 tablet (15 mg total) by mouth 2 (two) times daily. 01/28/21  Yes [name], MD  citalopram (CELEXA) 40 MG tablet Take 1 tablet (40 mg total) by mouth daily. 01/28/21  Yes [name], MD  hydrOXYzine (VISTARIL) 25 MG capsule Take 1 capsule (25 mg total) by mouth every 6 (six) hours as needed for anxiety. 01/28/21  Yes [name], MD  mometasone (ELOCON) 0.1 % ointment Apply 1 application topically daily as needed (dry skin). 01/28/21  Yes [name], MD  traZODone (DESYREL) 50 MG tablet TAKE 1 TABLET BY MOUTH EVERYDAY AT BEDTIME 04/19/21  Yes [name], MD    Physical Exam Vitals: Blood pressure 110/60, pulse 92, height 5\' 3"  (1.6 m), weight 150 lb (68 kg), last menstrual period 09/14/2021, currently breastfeeding.  General: NAD HEENT: normocephalic, anicteric Thyroid: no enlargement, no palpable nodules Pulmonary: No increased work of breathing, CTAB Cardiovascular: RRR, distal pulses 2+ Breast: Breast symmetrical, no tenderness, no palpable nodules or masses, no skin or nipple retraction present, no nipple discharge.  No axillary or supraclavicular lymphadenopathy. Abdomen: NABS, soft, non-tender, non-distended.  Umbilicus without lesions.  No hepatomegaly, splenomegaly or masses palpable. No evidence of hernia  Genitourinary:  External: Normal external female genitalia.  Normal urethral meatus, normal Bartholin's and Skene's glands.    Vagina: Normal vaginal mucosa, no evidence of prolapse.     Cervix: Grossly normal in appearance, no bleeding  Uterus: Non-enlarged, mobile, normal contour.  No CMT  Adnexa: ovaries non-enlarged, no adnexal masses  Rectal: deferred  Lymphatic: no evidence of inguinal lymphadenopathy Extremities: no edema, erythema, or tenderness Neurologic: Grossly intact Psychiatric: mood appropriate, affect full  Female chaperone present for pelvic and breast  portions of the physical exam    Assessment: 45 y.o. 60 routine annual exam  Plan: Problem List Items Addressed This Visit   None Visit Diagnoses     Need for diphtheria-tetanus-pertussis (Tdap) vaccine    -  Primary   Relevant Orders   Tdap vaccine greater than or equal to 7yo IM (Completed)   Encounter for gynecological examination without abnormal finding  Screening for malignant neoplasm of cervix       Relevant Orders   MM 3D SCREEN BREAST BILATERAL   Breast screening       Relevant Orders   Cytology - PAP   Colon cancer screening       Relevant Orders   Ambulatory referral to Gastroenterology       1) Mammogram - recommend yearly screening mammogram.  Mammogram Was ordered today   2) STI screening  was notoffered and therefore not obtained  3) ASCCP guidelines and rational discussed.  Patient opts for every 3 years screening interval  4) Contraception - the patient is currently using  tubal ligation.  She is happy with her current form of contraception and plans to continue - is interested in trying slynd for further cycle control/supression  5) Colonoscopy -- Screening recommended starting at age 62 for average risk individuals, age 68 for individuals deemed at increased risk (including African Americans) and recommended to continue until age 76.  For patient age 68-85 individualized approach is recommended.  Gold standard screening is via colonoscopy, Cologuard screening is an acceptable alternative for patient unwilling or unable to undergo colonoscopy.  "Colorectal  cancer screening for average?risk adults: 2018 guideline update from the American Cancer Society"CA: A Cancer Journal for Clinicians: May 16, 2017   6) Routine healthcare maintenance including cholesterol, diabetes screening discussed managed by PCP  7) No follow-ups on file.   [name], MD, [facility], [facility] 09/30/2021, 10:37 AM

## 2021-10-05 ENCOUNTER — Telehealth: Payer: Self-pay

## 2021-10-05 LAB — CYTOLOGY - PAP
Comment: NEGATIVE
Diagnosis: NEGATIVE
Diagnosis: REACTIVE
High risk HPV: NEGATIVE

## 2021-10-05 NOTE — Telephone Encounter (Signed)
Patient is ready to get Colonoscopy scheduled. Clinical staff will follow up with patient. 

## 2021-10-10 NOTE — Telephone Encounter (Signed)
Patient is in the [place] to be called.

## 2021-10-12 ENCOUNTER — Other Ambulatory Visit: Payer: Self-pay | Admitting: Obstetrics and Gynecology

## 2021-10-12 ENCOUNTER — Telehealth: Payer: Self-pay

## 2021-10-12 NOTE — Telephone Encounter (Signed)
Patient is ready to schedule procedure. Clinical staff will follow up with patient. °

## 2021-10-13 ENCOUNTER — Other Ambulatory Visit: Payer: Self-pay

## 2021-10-13 DIAGNOSIS — Z1211 Encounter for screening for malignant neoplasm of colon: Secondary | ICD-10-CM

## 2021-10-13 MED ORDER — NA SULFATE-K SULFATE-MG SULF 17.5-3.13-1.6 GM/177ML PO SOLN: 1.0000 | Freq: Once | ORAL | 0 refills | Status: AC

## 2021-10-13 NOTE — Progress Notes (Signed)
Gastroenterology Pre-Procedure Review  Request Date: 11/09/21 Requesting Physician: Dr. Maximino Greenland  PATIENT REVIEW QUESTIONS: The patient responded to the following health history questions as indicated:    1. Are you having any GI issues? no 2. Do you have a personal history of Polyps? no 3. Do you have a family history of Colon Cancer or Polyps? no 4. Diabetes Mellitus? no 5. Joint replacements in the past 12 months?no 6. Major health problems in the past 3 months?no 7. Any artificial heart valves, MVP, or defibrillator?no    MEDICATIONS & ALLERGIES:    Patient reports the following regarding taking any anticoagulation/antiplatelet therapy:   Plavix, Coumadin, Eliquis, Xarelto, Lovenox, Pradaxa, Brilinta, or Effient? no Aspirin? no  Patient confirms/reports the following medications:  Current Outpatient Medications  Medication Sig Dispense Refill   busPIRone (BUSPAR) 15 MG tablet Take 1 tablet (15 mg total) by mouth 2 (two) times daily. 180 tablet 1   citalopram (CELEXA) 40 MG tablet Take 1 tablet (40 mg total) by mouth daily. 90 tablet 4   Drospirenone (SLYND) 4 MG TABS Take 1 tablet by mouth daily. 28 tablet 2   hydrOXYzine (VISTARIL) 25 MG capsule Take 1 capsule (25 mg total) by mouth every 6 (six) hours as needed for anxiety. 40 capsule 2   mometasone (ELOCON) 0.1 % ointment Apply 1 application topically daily as needed (dry skin). 45 g 2   traZODone (DESYREL) 50 MG tablet TAKE 1 TABLET BY MOUTH EVERYDAY AT BEDTIME 90 tablet 4   No current facility-administered medications for this visit.    Patient confirms/reports the following allergies:  Allergies  Allergen Reactions   Prozac [Fluoxetine Hcl] Itching   Demerol [Meperidine] Nausea And Vomiting    No orders of the defined types were placed in this encounter.   AUTHORIZATION INFORMATION Primary Insurance: 1D#: Group #:  Secondary Insurance: 1D#: Group #:  SCHEDULE INFORMATION: Date:  11/09/21 Time: Location: [facility]

## 2021-10-13 NOTE — Telephone Encounter (Signed)
Procedure scheduled for 11/09/21.

## 2021-10-22 DIAGNOSIS — S8391XA Sprain of unspecified site of right knee, initial encounter: Secondary | ICD-10-CM | POA: Diagnosis not present

## 2021-10-24 ENCOUNTER — Other Ambulatory Visit: Payer: Self-pay

## 2021-10-24 ENCOUNTER — Ambulatory Visit
Admission: RE | Admit: 2021-10-24 | Discharge: 2021-10-24 | Disposition: A | Discharge status: Home / Self Care | Payer: BC Managed Care – PPO | Source: Ambulatory Visit | Attending: Obstetrics and Gynecology | Admitting: Obstetrics and Gynecology

## 2021-10-24 DIAGNOSIS — Z1231 Encounter for screening mammogram for malignant neoplasm of breast: Secondary | ICD-10-CM | POA: Insufficient documentation

## 2021-10-27 ENCOUNTER — Telehealth: Payer: Self-pay | Admitting: Gastroenterology

## 2021-10-27 NOTE — Telephone Encounter (Signed)
Patient wants to reschedule procedure. Clinical staff will follow up with patient. 

## 2021-10-28 NOTE — Telephone Encounter (Signed)
Patient has to reschedule due to insurance. Procedure has been moved to 12/23/21. Endo unit has been notified. Updated instructions will be sent via my chart.

## 2021-10-31 DIAGNOSIS — S8391XD Sprain of unspecified site of right knee, subsequent encounter: Secondary | ICD-10-CM | POA: Diagnosis not present

## 2021-11-02 DIAGNOSIS — Z20822 Contact with and (suspected) exposure to covid-19: Secondary | ICD-10-CM | POA: Diagnosis not present

## 2021-11-02 DIAGNOSIS — Z03818 Encounter for observation for suspected exposure to other biological agents ruled out: Secondary | ICD-10-CM | POA: Diagnosis not present

## 2021-11-28 DIAGNOSIS — S8391XD Sprain of unspecified site of right knee, subsequent encounter: Secondary | ICD-10-CM | POA: Diagnosis not present

## 2021-12-22 NOTE — Anesthesia Preprocedure Evaluation (Addendum)
Anesthesia Evaluation  Patient identified by MRN, date of birth, ID band Patient awake    Reviewed: Patient's Chart, lab work & pertinent test results, Unable to perform ROS - Chart review only  Airway Mallampati: II  TM Distance: >3 FB Neck ROM: Full    Dental no notable dental hx.    Pulmonary    Pulmonary exam normal breath sounds clear to auscultation       Cardiovascular Normal cardiovascular exam Rhythm:Regular Rate:Normal     Neuro/Psych Anxiety Depression    GI/Hepatic ROUTINE    Endo/Other    Renal/GU      Musculoskeletal   Abdominal   Peds  Hematology   Anesthesia Other Findings   Reproductive/Obstetrics                           Anesthesia Physical Anesthesia Plan  ASA: 1  Anesthesia Plan: General   Post-op Pain Management:    Induction: Intravenous  PONV Risk Score and Plan:   Airway Management Planned: Natural Airway and Nasal Cannula  Additional Equipment:   Intra-op Plan:   Post-operative Plan:   Informed Consent: I have reviewed the patients History and Physical, chart, labs and discussed the procedure including the risks, benefits and alternatives for the proposed anesthesia with the patient or authorized representative who has indicated his/her understanding and acceptance.     Dental Advisory Given  Plan Discussed with: Anesthesiologist, CRNA and Surgeon  Anesthesia Plan Comments: (Patient consented for risks of anesthesia including but not limited to:  - adverse reactions to medications - risk of airway placement if required - damage to eyes, teeth, lips or other oral mucosa - nerve damage due to positioning  - sore throat or hoarseness - Damage to heart, brain, nerves, lungs, other parts of body or loss of life  Patient voiced understanding.)       Anesthesia Quick Evaluation

## 2021-12-23 ENCOUNTER — Ambulatory Visit: Payer: BC Managed Care – PPO | Admitting: Anesthesiology

## 2021-12-23 ENCOUNTER — Encounter
Admission: RE | Disposition: A | Discharge status: Home / Self Care | Payer: Self-pay | Source: Home / Self Care | Attending: Gastroenterology

## 2021-12-23 ENCOUNTER — Encounter: Payer: Self-pay | Admitting: Gastroenterology

## 2021-12-23 ENCOUNTER — Ambulatory Visit
Admission: RE | Admit: 2021-12-23 | Discharge: 2021-12-23 | Disposition: A | Discharge status: Home / Self Care | Payer: BC Managed Care – PPO | Attending: Gastroenterology | Admitting: Gastroenterology

## 2021-12-23 DIAGNOSIS — Z1211 Encounter for screening for malignant neoplasm of colon: Secondary | ICD-10-CM | POA: Diagnosis not present

## 2021-12-23 DIAGNOSIS — Z0000 Encounter for general adult medical examination without abnormal findings: Secondary | ICD-10-CM

## 2021-12-23 DIAGNOSIS — F418 Other specified anxiety disorders: Secondary | ICD-10-CM | POA: Insufficient documentation

## 2021-12-23 HISTORY — PX: COLONOSCOPY WITH PROPOFOL: SHX5780

## 2021-12-23 LAB — POCT PREGNANCY, URINE: Preg Test, Ur: NEGATIVE

## 2021-12-23 SURGERY — COLONOSCOPY WITH PROPOFOL
Anesthesia: General

## 2021-12-23 MED ORDER — EPHEDRINE SULFATE 50 MG/ML IJ SOLN
INTRAMUSCULAR | Status: DC | PRN
  Administered 2021-12-23: 10 mg via INTRAVENOUS

## 2021-12-23 MED ORDER — LIDOCAINE HCL (CARDIAC) PF 100 MG/5ML IV SOSY
PREFILLED_SYRINGE | INTRAVENOUS | Status: DC | PRN
  Administered 2021-12-23: 100 mg via INTRAVENOUS

## 2021-12-23 MED ORDER — SODIUM CHLORIDE 0.9 % IV SOLN: INTRAVENOUS | Status: DC

## 2021-12-23 MED ORDER — DEXMEDETOMIDINE HCL 200 MCG/2ML IV SOLN
INTRAVENOUS | Status: DC | PRN
  Administered 2021-12-23: 20 ug via INTRAVENOUS

## 2021-12-23 MED ORDER — LIDOCAINE HCL (PF) 1 % IJ SOLN
INTRAMUSCULAR | Status: AC
  Filled 2021-12-23: qty 2

## 2021-12-23 MED ORDER — PROPOFOL 500 MG/50ML IV EMUL
INTRAVENOUS | Status: DC | PRN
  Administered 2021-12-23: 150 ug/kg/min via INTRAVENOUS

## 2021-12-23 MED ORDER — PHENYLEPHRINE HCL (PRESSORS) 10 MG/ML IV SOLN
INTRAVENOUS | Status: DC | PRN
  Administered 2021-12-23: 160 ug via INTRAVENOUS

## 2021-12-23 MED ORDER — PROPOFOL 10 MG/ML IV BOLUS
INTRAVENOUS | Status: DC | PRN
  Administered 2021-12-23: 10 mg via INTRAVENOUS
  Administered 2021-12-23: 30 mg via INTRAVENOUS
  Administered 2021-12-23: 60 mg via INTRAVENOUS

## 2021-12-23 NOTE — Op Note (Signed)
[facility]enterology Patient Name: Adrienne Richards Procedure Date: 12/23/2021 8:30 AM MRN: [id number] Account #: [id number] Date of Birth: [birth year] Admit Type: Outpatient Age: 46 Room: [facility] ENDO ROOM 2 Gender: Female Note Status: Finalized Instrument Name: Peds Colonoscope [id number] Procedure:             Colonoscopy Indications:           Screening for colorectal malignant neoplasm, This is                         the patient's first colonoscopy Providers:             Toney Reil MD, MD Referring MD:          Florina Ou. Bonney Aid (Referring MD) Medicines:             General Anesthesia Complications:         No immediate complications. Estimated blood loss: None. Procedure:             Pre-Anesthesia Assessment:                        - Prior to the procedure, a History and Physical was                         performed, and patient medications and allergies were                         reviewed. The patient is competent. The risks and                         benefits of the procedure and the sedation options and                         risks were discussed with the patient. All questions                         were answered and informed consent was obtained.                         Patient identification and proposed procedure were                         verified by the physician, the nurse, the                         anesthesiologist, the anesthetist and the technician                         in the pre-procedure area in the procedure room in the                         endoscopy suite. Mental Status Examination: alert and                         oriented. Airway Examination: normal oropharyngeal                         airway and neck mobility. Respiratory Examination:  clear to auscultation. CV Examination: normal.                         Prophylactic Antibiotics: The patient does not require                          prophylactic antibiotics. Prior Anticoagulants: The                         patient has taken no previous anticoagulant or                         antiplatelet agents. ASA Grade Assessment: I - A                         normal, healthy patient. After reviewing the risks and                         benefits, the patient was deemed in satisfactory                         condition to undergo the procedure. The anesthesia                         plan was to use general anesthesia. Immediately prior                         to administration of medications, the patient was                         re-assessed for adequacy to receive sedatives. The                         heart rate, respiratory rate, oxygen saturations,                         blood pressure, adequacy of pulmonary ventilation, and                         response to care were monitored throughout the                         procedure. The physical status of the patient was                         re-assessed after the procedure.                        After obtaining informed consent, the colonoscope was                         passed under direct vision. Throughout the procedure,                         the patient's blood pressure, pulse, and oxygen                         saturations were monitored continuously. The  Colonoscope was introduced through the anus and                         advanced to the the cecum, identified by appendiceal                         orifice and ileocecal valve. The colonoscopy was                         performed without difficulty. The patient tolerated                         the procedure well. The quality of the bowel                         preparation was evaluated using the BBPS [facility] Bowel                         Preparation Scale) with scores of: Right Colon = 3,                         Transverse Colon = 3 and Left Colon = 3 (entire mucosa                          seen well with no residual staining, small fragments                         of stool or opaque liquid). The total BBPS score                         equals 9. Findings:      The perianal and digital rectal examinations were normal. Pertinent       negatives include normal sphincter tone and no palpable rectal lesions.      The entire examined colon appeared normal.      The retroflexed view of the distal rectum and anal verge was normal and       showed no anal or rectal abnormalities. Impression:            - The entire examined colon is normal.                        - The distal rectum and anal verge are normal on                         retroflexion view.                        - No specimens collected. Recommendation:        - Discharge patient to home (with escort).                        - Resume regular diet today.                        - Continue present medications.                        - Repeat colonoscopy in 10 years  for screening                         purposes. Procedure Code(s):     --- Professional ---                        Z6109G0121, Colorectal cancer screening; colonoscopy on                         individual not meeting criteria for high risk Diagnosis Code(s):     --- Professional ---                        Z12.11, Encounter for screening for malignant neoplasm                         of colon CPT copyright 2019 American Medical Association. All rights reserved. The codes documented in this report are preliminary and upon coder review may  be revised to meet current compliance requirements. Dr. Libby Mawonini Leahna Hewson Toney Reilohini Reddy Chamaine Stankus MD, MD 12/23/2021 8:48:57 AM This report has been signed electronically. Number of Addenda: 0 Note Initiated On: 12/23/2021 8:30 AM Scope Withdrawal Time: 0 hours 6 minutes 31 seconds  Total Procedure Duration: 0 hours 9 minutes 3 seconds  Estimated Blood Loss:  Estimated blood loss: none.      [facility]

## 2021-12-23 NOTE — H&P (Signed)
Arlyss Repress, MD [address]  [address]  [place], [place] [address]  Main: [phone]  Fax: [phone] Pager: [phone]  Primary Care Physician:  [name], MD Primary Gastroenterologist:  Dr. Arlyss Repress  Pre-Procedure History & Physical: HPI:  KATYA ROLSTON is a 46 y.o. female is here for an colonoscopy.   Past Medical History:  Diagnosis Date   Depression     Past Surgical History:  Procedure Laterality Date   CESAREAN SECTION  2015   CESAREAN SECTION WITH BILATERAL TUBAL LIGATION  06/12/2017   Procedure: CESAREAN SECTION WITH BILATERAL TUBAL LIGATION;  Surgeon: [name], MD;  Location: [facility] ORS;  Service: Obstetrics;;   DILATION AND CURETTAGE OF UTERUS     DILATION AND EVACUATION N/A 08/08/2016   Procedure: DILATATION AND EVACUATION;  Surgeon: [name], MD;  Location: [facility] ORS;  Service: Gynecology;  Laterality: N/A;   PILONIDAL CYST EXCISION     TUBAL LIGATION  06/12/2017   VACUUM ASSISTED VAGINAL DELIVERY  2012   WISDOM TOOTH EXTRACTION      Prior to Admission medications   Medication Sig Start Date End Date Taking? Authorizing Provider  busPIRone (BUSPAR) 15 MG tablet TAKE 1 TABLET BY MOUTH 2 TIMES DAILY. 10/20/21   [name], MD  citalopram (CELEXA) 40 MG tablet Take 1 tablet (40 mg total) by mouth daily. 01/28/21   [name], MD  Drospirenone (SLYND) 4 MG TABS Take 1 tablet by mouth daily. 09/30/21   [name], MD  hydrOXYzine (VISTARIL) 25 MG capsule Take 1 capsule (25 mg total) by mouth every 6 (six) hours as needed for anxiety. 01/28/21   [name], MD  mometasone (ELOCON) 0.1 % ointment Apply 1 application topically daily as needed (dry skin). 01/28/21   [name], MD  traZODone (DESYREL) 50 MG tablet TAKE 1 TABLET BY MOUTH EVERYDAY AT BEDTIME 04/19/21   [name], MD    Allergies as of 10/13/2021 - Review Complete 09/30/2021  Allergen Reaction Noted   Prozac [fluoxetine hcl]  Itching 08/03/2016   Demerol [meperidine] Nausea And Vomiting 04/16/2014    Family History  Problem Relation Age of Onset   Heart disease Maternal Grandmother    Hypertension Paternal Grandmother    Migraines Paternal Grandmother    Neurologic Disorder Mother     Social History   Socioeconomic History   Marital status: Married    Spouse name: Not on file   Number of children: Not on file   Years of education: Not on file   Highest education level: Not on file  Occupational History   Not on file  Tobacco Use   Smoking status: Never   Smokeless tobacco: Never  Vaping Use   Vaping Use: Never used  Substance and Sexual Activity   Alcohol use: Yes    Comment: occassional when not pregnant   Drug use: No   Sexual activity: Yes    Birth control/protection: Surgical    Comment: BTL  Other Topics Concern   Not on file  Social History Narrative   Not on file   Social Determinants of Health   Financial Resource Strain: Not on file  Food Insecurity: Not on file  Transportation Needs: Not on file  Physical Activity: Not on file  Stress: Not on file  Social Connections: Not on file  Intimate Partner Violence: Not on file    Review of Systems: See HPI, otherwise negative ROS  Physical Exam: BP 113/78    Pulse 89  Temp (!) 97.2 F (36.2 C) (Temporal)    Resp 16    Ht 5\' 2"  (1.575 m)    Wt 65.8 kg    SpO2 100%    BMI 26.52 kg/m  General:   Alert,  pleasant and cooperative in NAD Head:  Normocephalic and atraumatic. Neck:  Supple; no masses or thyromegaly. Lungs:  Clear throughout to auscultation.    Heart:  Regular rate and rhythm. Abdomen:  Soft, nontender and nondistended. Normal bowel sounds, without guarding, and without rebound.   Neurologic:  Alert and  oriented x4;  grossly normal neurologically.  Impression/Plan: JONNETTE NUON is here for an colonoscopy to be performed for colon cancer screening  Risks, benefits, limitations, and alternatives regarding   colonoscopy have been reviewed with the patient.  Questions have been answered.  All parties agreeable.   [name], MD  12/23/2021, 8:26 AM

## 2021-12-23 NOTE — Anesthesia Postprocedure Evaluation (Signed)
Anesthesia Post Note  Patient: Adrienne Richards  Procedure(s) Performed: COLONOSCOPY WITH PROPOFOL  Anesthesia Type: General Anesthetic complications: no   There were no known notable events for this encounter.   Last Vitals:  Vitals:   12/23/21 0850 12/23/21 0900  BP: (!) 74/42 100/63  Pulse: 81 93  Resp: 19 (!) 24  Temp: (!) 36.2 C   SpO2: 100% 99%    Last Pain:  Vitals:   12/23/21 0850  TempSrc: Temporal                 Jerlyn Ly

## 2021-12-23 NOTE — Transfer of Care (Signed)
Immediate Anesthesia Transfer of Care Note  Patient: Adrienne Richards  Procedure(s) Performed: COLONOSCOPY WITH PROPOFOL  Patient Location: Endoscopy Unit  Anesthesia Type:General  Level of Consciousness: drowsy  Airway & Oxygen Therapy: Patient Spontanous Breathing  Post-op Assessment: Report given to RN and Post -op Vital signs reviewed and stable  Post vital signs: Reviewed and stable  Last Vitals:  Vitals Value Taken Time  BP 89/54 12/23/21 0854  Temp 36.2 C 12/23/21 0850  Pulse 60 12/23/21 0854  Resp 18 12/23/21 0854  SpO2 100 % 12/23/21 0854  Vitals shown include unvalidated device data.  Last Pain:  Vitals:   12/23/21 0850  TempSrc: Temporal         Complications: No notable events documented.

## 2021-12-26 ENCOUNTER — Encounter: Payer: Self-pay | Admitting: Gastroenterology

## 2022-04-13 ENCOUNTER — Telehealth: Payer: Self-pay

## 2022-04-13 MED ORDER — CITALOPRAM HYDROBROMIDE 40 MG PO TABS: 40.0000 mg | ORAL_TABLET | Freq: Every day | ORAL | 0 refills | Status: AC

## 2022-04-13 NOTE — Telephone Encounter (Signed)
Pt needing a refill on her celexa. She was not aware that [facility] not here. She asked if [facility] was here, I advised her that he is not either. She wants me to try to see if another provider would be able to refill this for her at least for a month to get somewhere else. Would you be willing to refill this for her? I was told to ask who was on call. Let meknow and I can contact the patient  ?

## 2022-04-13 NOTE — Telephone Encounter (Signed)
Pt aware of refill.

## 2022-04-13 NOTE — Telephone Encounter (Signed)
That's fine. I will give her a 3 month supply as most offices are backed up 1-2 months for appointments for new patients.  ? ?Dr. Valentino Saxon ?

## 2022-04-27 DIAGNOSIS — Z6826 Body mass index (BMI) 26.0-26.9, adult: Secondary | ICD-10-CM | POA: Diagnosis not present

## 2022-04-27 DIAGNOSIS — F32A Depression, unspecified: Secondary | ICD-10-CM | POA: Diagnosis not present

## 2022-07-12 ENCOUNTER — Telehealth: Payer: Self-pay

## 2022-07-12 NOTE — Telephone Encounter (Signed)
Request from pharm for trazodone 50mg  tablet one po qd at bedtime recv'd; last filled 04/11/22.  Pt isn't due for annual until October this year.

## 2022-07-14 ENCOUNTER — Other Ambulatory Visit: Payer: Self-pay | Admitting: Licensed Practical Nurse

## 2022-07-14 DIAGNOSIS — G4709 Other insomnia: Secondary | ICD-10-CM

## 2022-09-11 DIAGNOSIS — Z6827 Body mass index (BMI) 27.0-27.9, adult: Secondary | ICD-10-CM | POA: Diagnosis not present

## 2022-09-11 DIAGNOSIS — R159 Full incontinence of feces: Secondary | ICD-10-CM | POA: Diagnosis not present

## 2022-09-11 DIAGNOSIS — Z01419 Encounter for gynecological examination (general) (routine) without abnormal findings: Secondary | ICD-10-CM | POA: Diagnosis not present

## 2022-09-11 DIAGNOSIS — F329 Major depressive disorder, single episode, unspecified: Secondary | ICD-10-CM | POA: Diagnosis not present

## 2022-09-11 DIAGNOSIS — G4700 Insomnia, unspecified: Secondary | ICD-10-CM | POA: Diagnosis not present

## 2022-09-11 DIAGNOSIS — F419 Anxiety disorder, unspecified: Secondary | ICD-10-CM | POA: Diagnosis not present

## 2022-10-04 NOTE — Telephone Encounter (Signed)
Two months has gone by; haven't heard anymore from pharm nor the pt.

## 2022-10-26 DIAGNOSIS — Z30430 Encounter for insertion of intrauterine contraceptive device: Secondary | ICD-10-CM | POA: Diagnosis not present

## 2022-10-26 DIAGNOSIS — Z1231 Encounter for screening mammogram for malignant neoplasm of breast: Secondary | ICD-10-CM | POA: Diagnosis not present

## 2022-10-26 DIAGNOSIS — Z3202 Encounter for pregnancy test, result negative: Secondary | ICD-10-CM | POA: Diagnosis not present

## 2022-11-09 IMAGING — MG MM DIGITAL SCREENING BILAT W/ TOMO AND CAD
8 series · 8 of 24 positions shown · non-contrast
Comparison: Previous exam(s).

CLINICAL DATA: Screening.

EXAM:
DIGITAL SCREENING BILATERAL MAMMOGRAM WITH TOMOSYNTHESIS AND CAD
TECHNIQUE: Bilateral screening digital craniocaudal and mediolateral oblique
mammograms were obtained. Bilateral screening digital breast
tomosynthesis was performed. The images were evaluated with
computer-aided detection.

[R CC synth-2D]
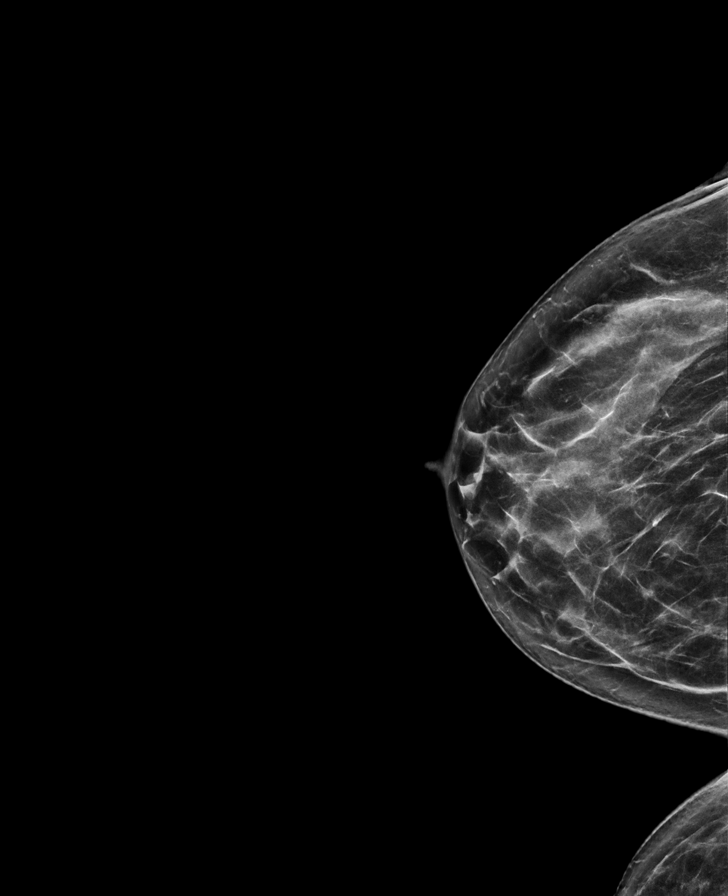

[R MLO synth-2D]
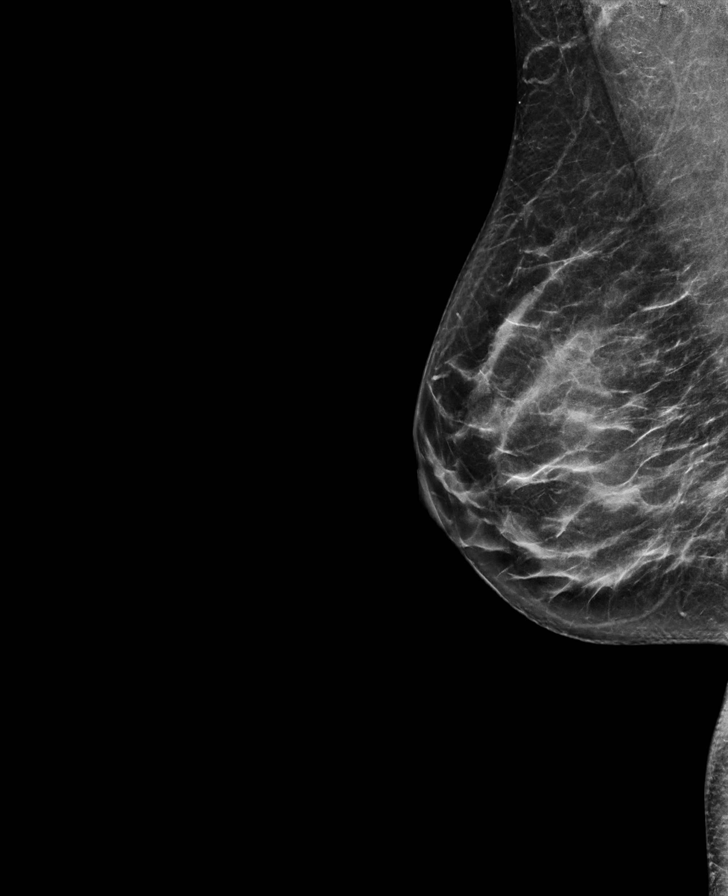

[L MLO synth-2D]
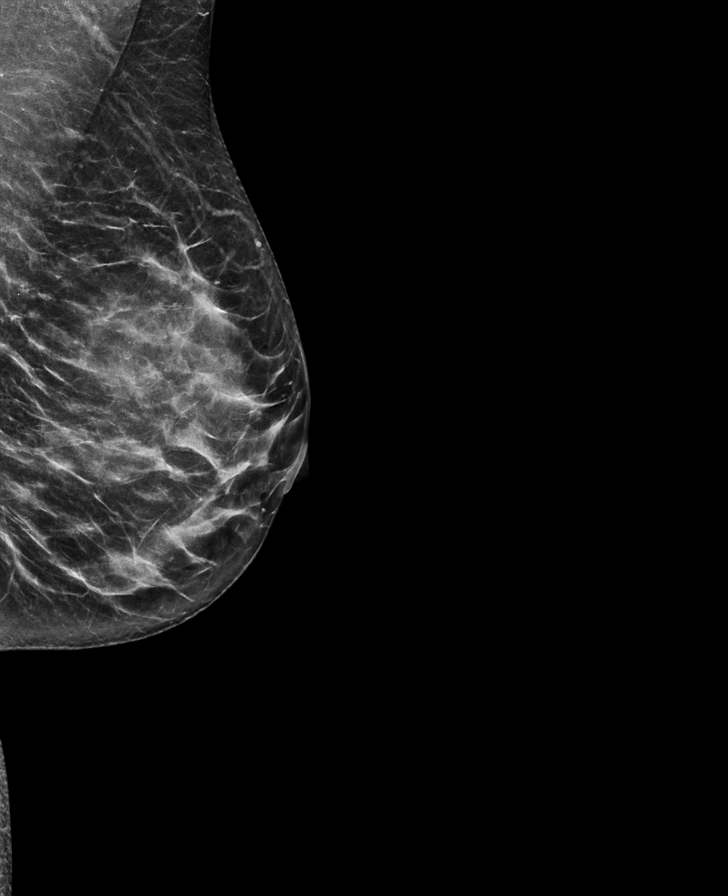

[L CC synth-2D]
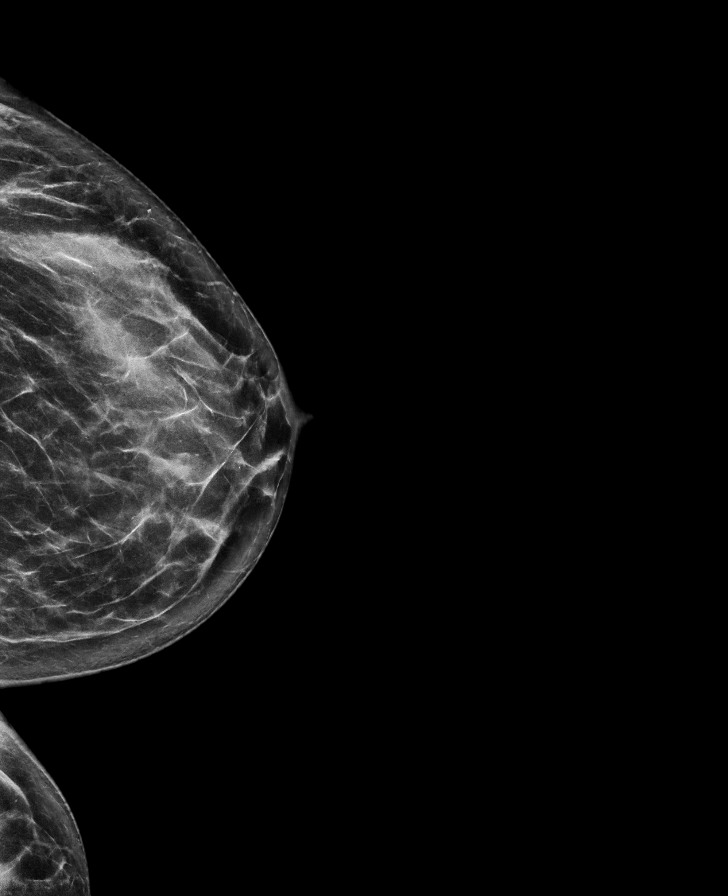

[R MLO tomo · tomo slice 34/67.0]
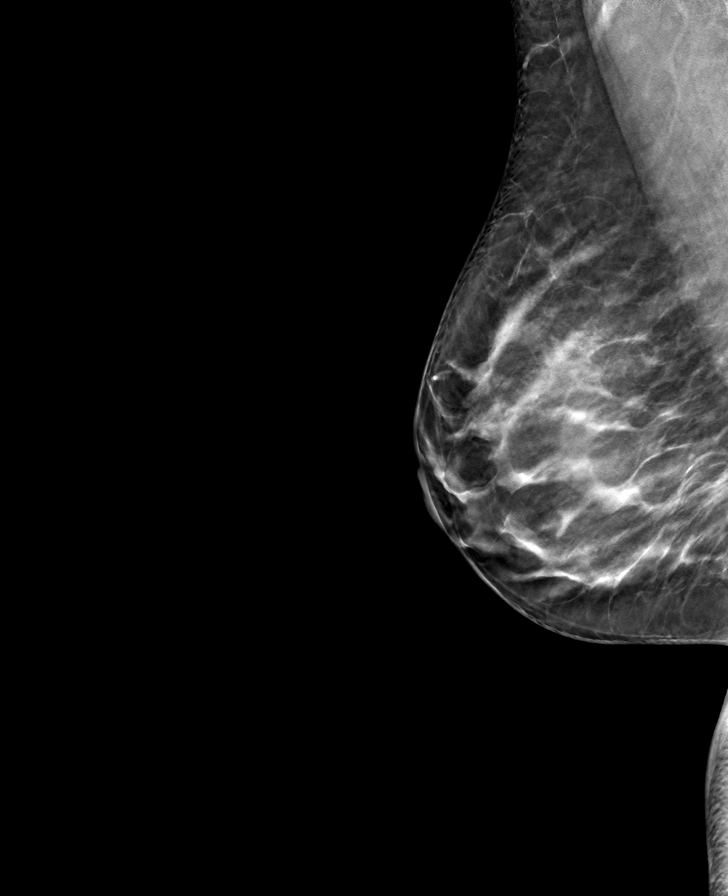

[R CC tomo · tomo slice 34/67.0]
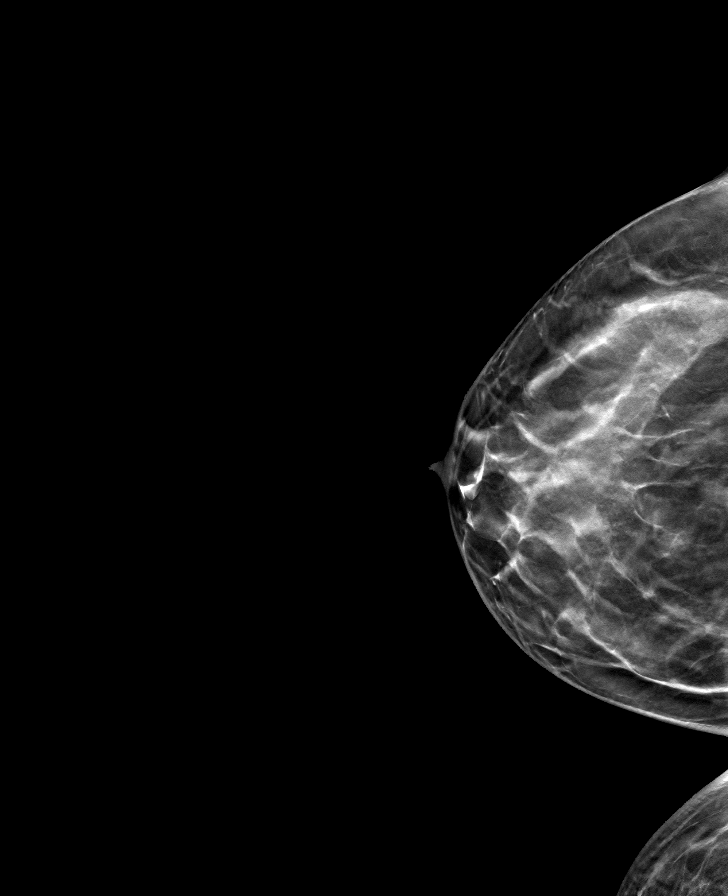

[L CC tomo · tomo slice 35/70.0]
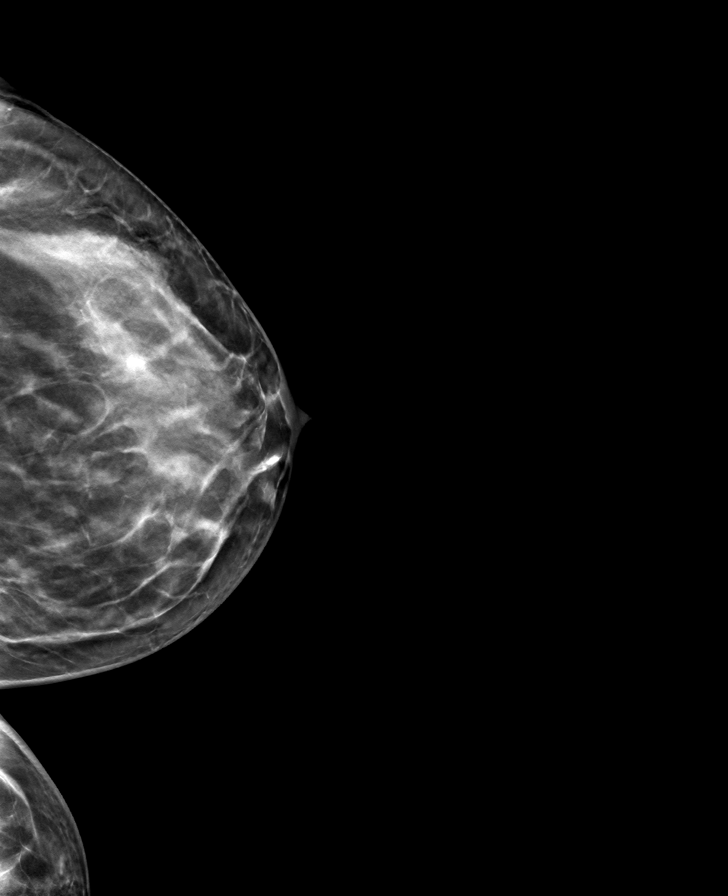

[L MLO tomo · tomo slice 33/65.0]
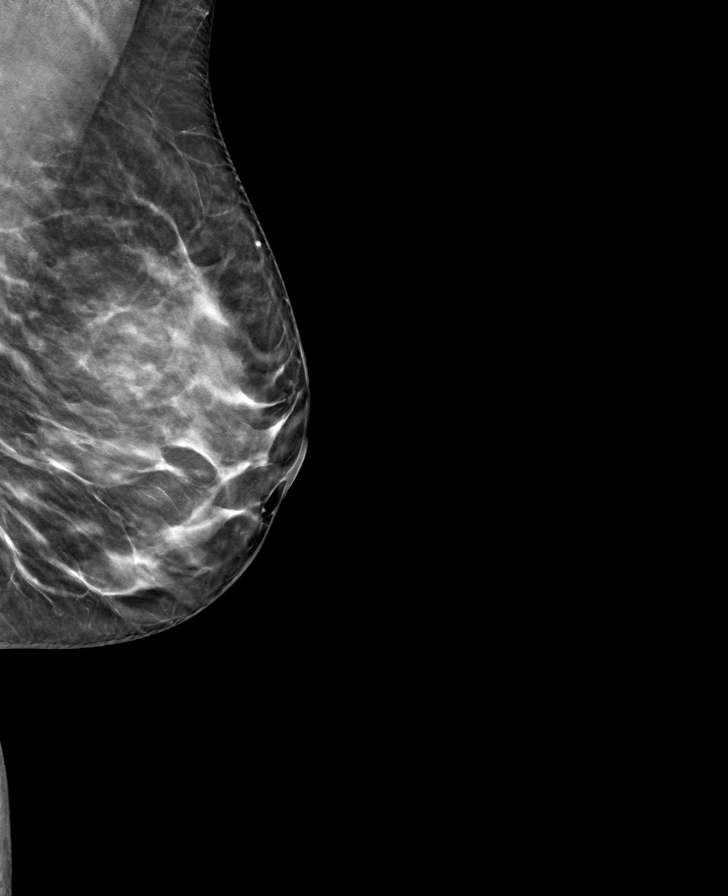

[8 of 24 positions shown; findings below may reference images not displayed]

ACR Breast Density Category c: The breast tissue is heterogeneously
dense, which may obscure small masses.
FINDINGS: There are no findings suspicious for malignancy.
IMPRESSION: No mammographic evidence of malignancy. A result letter of this
screening mammogram will be mailed directly to the patient.

RECOMMENDATION:
Screening mammogram in one year. (Code:[id number])

BI-RADS CATEGORY  1: Negative.

## 2022-11-24 DIAGNOSIS — Z30431 Encounter for routine checking of intrauterine contraceptive device: Secondary | ICD-10-CM | POA: Diagnosis not present

## 2023-01-04 DIAGNOSIS — R159 Full incontinence of feces: Secondary | ICD-10-CM | POA: Diagnosis not present

## 2023-01-04 DIAGNOSIS — R82998 Other abnormal findings in urine: Secondary | ICD-10-CM | POA: Diagnosis not present

## 2023-01-04 DIAGNOSIS — K589 Irritable bowel syndrome without diarrhea: Secondary | ICD-10-CM | POA: Diagnosis not present

## 2023-01-04 DIAGNOSIS — N393 Stress incontinence (female) (male): Secondary | ICD-10-CM | POA: Diagnosis not present

## 2023-01-19 DIAGNOSIS — N8189 Other female genital prolapse: Secondary | ICD-10-CM | POA: Diagnosis not present

## 2023-01-19 DIAGNOSIS — M6289 Other specified disorders of muscle: Secondary | ICD-10-CM | POA: Diagnosis not present

## 2023-01-19 DIAGNOSIS — N393 Stress incontinence (female) (male): Secondary | ICD-10-CM | POA: Diagnosis not present

## 2023-01-19 DIAGNOSIS — R278 Other lack of coordination: Secondary | ICD-10-CM | POA: Diagnosis not present

## 2023-02-02 DIAGNOSIS — R278 Other lack of coordination: Secondary | ICD-10-CM | POA: Diagnosis not present

## 2023-02-02 DIAGNOSIS — N8189 Other female genital prolapse: Secondary | ICD-10-CM | POA: Diagnosis not present

## 2023-02-02 DIAGNOSIS — M6289 Other specified disorders of muscle: Secondary | ICD-10-CM | POA: Diagnosis not present

## 2023-02-02 DIAGNOSIS — N393 Stress incontinence (female) (male): Secondary | ICD-10-CM | POA: Diagnosis not present

## 2023-02-16 DIAGNOSIS — N393 Stress incontinence (female) (male): Secondary | ICD-10-CM | POA: Diagnosis not present

## 2023-02-16 DIAGNOSIS — M6289 Other specified disorders of muscle: Secondary | ICD-10-CM | POA: Diagnosis not present

## 2023-02-16 DIAGNOSIS — N8189 Other female genital prolapse: Secondary | ICD-10-CM | POA: Diagnosis not present

## 2023-02-16 DIAGNOSIS — R278 Other lack of coordination: Secondary | ICD-10-CM | POA: Diagnosis not present

## 2023-03-13 ENCOUNTER — Encounter: Payer: Self-pay | Admitting: Nurse Practitioner

## 2023-03-13 ENCOUNTER — Ambulatory Visit: Payer: BC Managed Care – PPO | Admitting: Nurse Practitioner

## 2023-03-13 VITALS — BP 88/60 | HR 82 | Temp 97.8°F | Resp 16 | Ht 62.0 in | Wt 157.2 lb

## 2023-03-13 DIAGNOSIS — Z111 Encounter for screening for respiratory tuberculosis: Secondary | ICD-10-CM

## 2023-03-13 DIAGNOSIS — F419 Anxiety disorder, unspecified: Secondary | ICD-10-CM | POA: Diagnosis not present

## 2023-03-13 DIAGNOSIS — E663 Overweight: Secondary | ICD-10-CM | POA: Diagnosis not present

## 2023-03-13 DIAGNOSIS — Z0000 Encounter for general adult medical examination without abnormal findings: Secondary | ICD-10-CM | POA: Diagnosis not present

## 2023-03-13 DIAGNOSIS — Z13220 Encounter for screening for lipoid disorders: Secondary | ICD-10-CM

## 2023-03-13 NOTE — Progress Notes (Addendum)
New Patient Office Visit  Subjective    Patient ID: Adrienne Richards, female    DOB: 07-09-1976  Age: 47 y.o. MRN: [id number]  CC:  Chief Complaint  Patient presents with   Establish Care   Annual Exam    HPI HARLYM HULSMAN presents to establish care  Anxiety/depression: maintained on buspirone 15 mg twice daily citalopram 40 mg daily trazodone 50 mg nightly as needed and hydroxyzine as needed.  for complete physical and follow up of chronic conditions.  Immunizations: -Tetanus: Completed in 2022 -Influenza: Completed this season -Shingles: Continue -Pneumonia: Too young - covid: UTD  Diet: Fair diet. States that she eats 3 meals a day. States that she will skip lunch on occasion. Water and iced coffee  Exercise: No regular exercise. Once a week. She will do cardio she will do 30-60 mins  Eye exam: Completes annually. Within the year. Glasses for distance   Dental exam: Completes semi-annually    Colonoscopy: Completed in 12/23/2021, recall 10 years  Lung Cancer Screening: NA   Pap Smear: [facility]. Within 3 years.  Mammogram: utd done at GYN office   Sleep: states that she goes to bed 1030-12 and get up at 9 Feels rested. States she needs 9 hours . Does not snore     Outpatient Encounter Medications as of 03/13/2023  Medication Sig   busPIRone (BUSPAR) 15 MG tablet TAKE 1 TABLET BY MOUTH 2 TIMES DAILY.   citalopram (CELEXA) 40 MG tablet Take 1 tablet (40 mg total) by mouth daily.   hydrOXYzine (VISTARIL) 25 MG capsule Take 1 capsule (25 mg total) by mouth every 6 (six) hours as needed for anxiety.   mometasone (ELOCON) 0.1 % ointment Apply 1 application topically daily as needed (dry skin).   Pediatric Multivit-Minerals (FLINTSTONES GUMMIES COMPLETE) CHEW Chew 1 tablet by mouth daily.   traZODone (DESYREL) 50 MG tablet TAKE 1 TABLET BY MOUTH EVERYDAY AT BEDTIME   Drospirenone (SLYND) 4 MG TABS Take 1 tablet by mouth daily.   No facility-administered encounter  medications on file as of 03/13/2023.    Past Medical History:  Diagnosis Date   Depression     Past Surgical History:  Procedure Laterality Date   CESAREAN SECTION  2015   CESAREAN SECTION WITH BILATERAL TUBAL LIGATION  06/12/2017   Procedure: CESAREAN SECTION WITH BILATERAL TUBAL LIGATION;  Surgeon: Malachy Mood, MD;  Location: [facility] ORS;  Service: Obstetrics;;   COLONOSCOPY WITH PROPOFOL N/A 12/23/2021   Procedure: COLONOSCOPY WITH PROPOFOL;  Surgeon: Lin Landsman, MD;  Location: [facility] ENDOSCOPY;  Service: Endoscopy;  Laterality: N/A;   DILATION AND CURETTAGE OF UTERUS     DILATION AND EVACUATION N/A 08/08/2016   Procedure: DILATATION AND EVACUATION;  Surgeon: Malachy Mood, MD;  Location: [facility] ORS;  Service: Gynecology;  Laterality: N/A;   PILONIDAL CYST EXCISION     TUBAL LIGATION  06/12/2017   VACUUM ASSISTED VAGINAL DELIVERY  2012   WISDOM TOOTH EXTRACTION      Family History  Problem Relation Age of Onset   Neurologic Disorder Mother        mini black outs   Skin cancer Mother    Skin cancer Father    Heart disease Maternal Grandmother     Social History   Socioeconomic History   Marital status: Married    Spouse name: Lennette Bihari   Number of children: 3   Years of education: Not on file   Highest education level: Not on file  Occupational History   Not on file  Tobacco Use   Smoking status: Never   Smokeless tobacco: Never  Vaping Use   Vaping Use: Never used  Substance and Sexual Activity   Alcohol use: Yes    Comment: occassional when not pregnant   Drug use: No   Sexual activity: Yes    Birth control/protection: Surgical    Comment: BTL  Other Topics Concern   Not on file  Social History Narrative   Fulltime: [employer] (11)   Ronith Berti (10)   Chartered loss adjuster (5)      Hobbies: read (audio books), Crochet    Social Determinants of Health   Financial Resource Strain: Not on file  Food Insecurity: Not on file   Transportation Needs: Not on file  Physical Activity: Insufficiently Active (08/26/2019)   Exercise Vital Sign    Days of Exercise per Week: 1 day    Minutes of Exercise per Session: 60 min  Stress: Stress Concern Present (08/26/2019)   [place]    Feeling of Stress : To some extent  Social Connections: Moderately Integrated (08/26/2019)   Social Connection and Isolation Panel [NHANES]    Frequency of Communication with Friends and Family: More than three times a week    Frequency of Social Gatherings with Friends and Family: Once a week    Attends Religious Services: More than 4 times per year    Active Member of [employer] or Organizations: No    Attends Archivist Meetings: Never    Marital Status: Married  Human resources officer Violence: Not At Risk (08/26/2019)   Humiliation, Afraid, Rape, and Kick questionnaire    Fear of Current or Ex-Partner: No    Emotionally Abused: No    Physically Abused: No    Sexually Abused: No    Review of Systems  Constitutional:  Negative for chills and fever.  Respiratory:  Negative for shortness of breath.   Cardiovascular:  Negative for chest pain and leg swelling.  Gastrointestinal:  Negative for abdominal pain, blood in stool, constipation, diarrhea, nausea and vomiting.       BM daily   Genitourinary:  Negative for dysuria and hematuria.  Neurological:  Negative for tingling and headaches.  Psychiatric/Behavioral:  Negative for hallucinations and suicidal ideas.         Objective    BP (!) 88/60   Pulse 82   Temp 97.8 F (36.6 C)   Resp 16   Ht 5\' 2"  (1.575 m)   Wt 157 lb 4 oz (71.3 kg)   SpO2 99%   Breastfeeding No   BMI 28.76 kg/m   Physical Exam Vitals and nursing note reviewed.  Constitutional:      Appearance: Normal appearance.  HENT:     Right Ear: Tympanic membrane, ear canal and external ear normal.     Left Ear: Tympanic membrane, ear canal and  external ear normal.     Mouth/Throat:     Mouth: Mucous membranes are moist.     Pharynx: Oropharynx is clear.  Eyes:     Extraocular Movements: Extraocular movements intact.     Pupils: Pupils are equal, round, and reactive to light.  Cardiovascular:     Rate and Rhythm: Normal rate and regular rhythm.     Pulses: Normal pulses.     Heart sounds: Normal heart sounds.  Pulmonary:     Effort: Pulmonary effort is  normal.     Breath sounds: Normal breath sounds.  Abdominal:     General: Bowel sounds are normal. There is no distension.     Palpations: There is no mass.     Tenderness: There is no abdominal tenderness.     Hernia: No hernia is present.  Musculoskeletal:     Right lower leg: No edema.     Left lower leg: No edema.  Lymphadenopathy:     Cervical: No cervical adenopathy.  Skin:    General: Skin is warm.  Neurological:     General: No focal deficit present.     Mental Status: She is alert.     Deep Tendon Reflexes:     Reflex Scores:      Bicep reflexes are 2+ on the right side and 2+ on the left side.      Patellar reflexes are 2+ on the right side and 2+ on the left side.    Comments: Bilateral upper and lower extremity strength 5/5  Psychiatric:        Mood and Affect: Mood normal.        Behavior: Behavior normal.        Thought Content: Thought content normal.        Judgment: Judgment normal.         Assessment & Plan:   Problem List Items Addressed This Visit       Other   Preventative health care    Discussed age-appropriate immunizations and screening exams.  Did review patient's personal, surgical, social, family histories.  Patient is up-to-date on her tetanus vaccine, COVID-vaccine, flu vaccine.  She is too young for the shingles and pneumonia vaccine.  Patient is up-to-date on her CRC screening, cervical cancer screening, breast cancer screening.  Patient was given information at discharge about preventative healthcare maintenance with  anticipatory guidance.      Relevant Orders   CBC   Comprehensive metabolic panel   TSH   Overweight    Did review the recommendations of 30 minutes of exercise 5 times a week.      Relevant Orders   Hemoglobin A1c   Lipid panel   TSH   Anxiety    Patient is currently maintained on citalopram 40 mg daily, buspirone 15 mg twice daily, hydroxyzine as needed.  Trazodone nightly as needed.  Continue medication as prescribed patient denies HI/SI/AVH.      Relevant Orders   TSH   Other Visit Diagnoses     Screening for tuberculosis    -  Primary   Relevant Orders   PPD (Completed)   Screening for lipid disorders       Relevant Orders   Lipid panel       Return in about 1 year (around 03/12/2024) for CPE and Labs.   Romilda Garret, NP

## 2023-03-13 NOTE — Patient Instructions (Signed)
Nice to see you today I will be in touch with the labs once I have them Follow up in 2 days for the reading of the TB test and make a lab appointment around the same time to get your fasting labs done Follow up with me in 1 year for your next physical and labs, sooner if you need me

## 2023-03-13 NOTE — Assessment & Plan Note (Signed)
Did review the recommendations of 30 minutes of exercise 5 times a week.

## 2023-03-13 NOTE — Assessment & Plan Note (Signed)
Discussed age-appropriate immunizations and screening exams.  Did review patient's personal, surgical, social, family histories.  Patient is up-to-date on her tetanus vaccine, COVID-vaccine, flu vaccine.  She is too young for the shingles and pneumonia vaccine.  Patient is up-to-date on her CRC screening, cervical cancer screening, breast cancer screening.  Patient was given information at discharge about preventative healthcare maintenance with anticipatory guidance.

## 2023-03-13 NOTE — Progress Notes (Signed)
PPD Placement note Adrienne Richards, 47 y.o. female is here today for placement of PPD test Reason for PPD test: Employment Pt taken PPD test before: yes Verified in allergy area and with patient that they are not allergic to the products PPD is made of (Phenol or Tween). Yes Is patient taking any oral or IV steroid medication now or have they taken it in the last month? no Has the patient ever received the BCG vaccine?: no Has the patient been in recent contact with anyone known or suspected of having active TB disease?: no      Patient's Country of origin?: [place] O: Alert and oriented in NAD. P:  PPD placed on 03/13/2023.  Patient advised to return for reading within 48-72 hours.

## 2023-03-13 NOTE — Assessment & Plan Note (Signed)
Patient is currently maintained on citalopram 40 mg daily, buspirone 15 mg twice daily, hydroxyzine as needed.  Trazodone nightly as needed.  Continue medication as prescribed patient denies HI/SI/AVH.

## 2023-03-15 ENCOUNTER — Other Ambulatory Visit (INDEPENDENT_AMBULATORY_CARE_PROVIDER_SITE_OTHER): Payer: BC Managed Care – PPO

## 2023-03-15 ENCOUNTER — Telehealth: Payer: Self-pay

## 2023-03-15 ENCOUNTER — Ambulatory Visit: Payer: BC Managed Care – PPO

## 2023-03-15 DIAGNOSIS — E663 Overweight: Secondary | ICD-10-CM

## 2023-03-15 DIAGNOSIS — Z0000 Encounter for general adult medical examination without abnormal findings: Secondary | ICD-10-CM

## 2023-03-15 DIAGNOSIS — F419 Anxiety disorder, unspecified: Secondary | ICD-10-CM

## 2023-03-15 DIAGNOSIS — Z13220 Encounter for screening for lipoid disorders: Secondary | ICD-10-CM | POA: Diagnosis not present

## 2023-03-15 DIAGNOSIS — Z1283 Encounter for screening for malignant neoplasm of skin: Secondary | ICD-10-CM

## 2023-03-15 LAB — HEMOGLOBIN A1C: Hgb A1c MFr Bld: 5.2 % (ref 4.6–6.5)

## 2023-03-15 LAB — COMPREHENSIVE METABOLIC PANEL
ALT: 5 U/L (ref 0–35)
AST: 15 U/L (ref 0–37)
Albumin: 4.1 g/dL (ref 3.5–5.2)
Alkaline Phosphatase: 36 U/L — ABNORMAL LOW (ref 39–117)
BUN: 8 mg/dL (ref 6–23)
CO2: 27 mEq/L (ref 19–32)
Calcium: 8.7 mg/dL (ref 8.4–10.5)
Chloride: 104 mEq/L (ref 96–112)
Creatinine, Ser: 0.79 mg/dL (ref 0.40–1.20)
GFR: 89.82 mL/min (ref >60.00)
Glucose, Bld: 84 mg/dL (ref 70–99)
Potassium: 4.1 mEq/L (ref 3.5–5.1)
Sodium: 136 mEq/L (ref 135–145)
Total Bilirubin: 0.5 mg/dL (ref 0.2–1.2)
Total Protein: 6.6 g/dL (ref 6.0–8.3)

## 2023-03-15 LAB — LIPID PANEL
Cholesterol: 180 mg/dL (ref 0–200)
HDL: 70.3 mg/dL (ref >39.00)
LDL Cholesterol: 98 mg/dL (ref 0–99)
NonHDL: 109.69
Total CHOL/HDL Ratio: 3
Triglycerides: 59 mg/dL (ref 0.0–149.0)
VLDL: 11.8 mg/dL (ref 0.0–40.0)

## 2023-03-15 LAB — CBC
HCT: 33 % — ABNORMAL LOW (ref 36.0–46.0)
Hemoglobin: 11.2 g/dL — ABNORMAL LOW (ref 12.0–15.0)
MCHC: 34.1 g/dL (ref 30.0–36.0)
MCV: 88.1 fl (ref 78.0–100.0)
Platelets: 416 10*3/uL — ABNORMAL HIGH (ref 150.0–400.0)
RBC: 3.74 Mil/uL — ABNORMAL LOW (ref 3.87–5.11)
RDW: 15.2 % (ref 11.5–15.5)
WBC: 7 10*3/uL (ref 4.0–10.5)

## 2023-03-15 LAB — TSH: TSH: 1.29 u[IU]/mL (ref 0.35–5.50)

## 2023-03-15 LAB — TB SKIN TEST
Induration: 0 mm
TB Skin Test: NEGATIVE

## 2023-03-15 NOTE — Telephone Encounter (Signed)
Formed filled out and placed on [employer] to be copied and faxed.   Referral for Dermatology placed for Skin survey

## 2023-03-15 NOTE — Progress Notes (Signed)
PPD Reading Note  PPD read and results entered in EpicCare.  Result: 0 mm induration.  Interpretation: negative  If test not read within 48-72 hours of initial placement, patient advised to repeat in other arm 1-3 weeks after this test.  Allergic reaction: no

## 2023-03-15 NOTE — Telephone Encounter (Signed)
Called pt and informed her that form is ready for pick up. One copy sent to scan.

## 2023-03-15 NOTE — Telephone Encounter (Signed)
Pt came in for NV today to have PPD skin test read. She needs her form completed and wants a call when ready to pick up.   Also, she asks about dermatology referral that was discussed "in passing" with [facility]. Pt says she requested a full body scan.

## 2023-03-15 NOTE — Addendum Note (Signed)
Addended by: Michela Pitcher on: 03/15/2023 01:05 PM   Modules accepted: Orders

## 2023-07-16 ENCOUNTER — Telehealth: Payer: BC Managed Care – PPO | Admitting: Nurse Practitioner

## 2023-07-16 DIAGNOSIS — L249 Irritant contact dermatitis, unspecified cause: Secondary | ICD-10-CM

## 2023-07-18 MED ORDER — CETIRIZINE HCL 10 MG PO TABS: 10.0000 mg | ORAL_TABLET | Freq: Every day | ORAL | 0 refills | Status: AC

## 2023-07-18 MED ORDER — TRIAMCINOLONE ACETONIDE 0.1 % EX CREA: 1.0000 | TOPICAL_CREAM | Freq: Two times a day (BID) | CUTANEOUS | 0 refills | Status: AC

## 2023-07-18 NOTE — Progress Notes (Signed)
E Visit for Rash  We are sorry that you are not feeling well. Here is how we plan to help!  Based on what you shared with me it looks like you have contact dermatitis.  Contact dermatitis is a skin rash caused by something that touches the skin and causes irritation or inflammation.  Your skin may be red, swollen, dry, cracked, and itch.  The rash should go away in a few days but can last a few weeks.  If you get a rash, it's important to figure out what caused it so the irritant can be avoided in the future.  We will prescribe a topical steroid and recommend you take an allergy medication at night.  Avoid soaps and lotions that are scented or colored with dye.   Meds ordered this encounter  Medications   triamcinolone cream (KENALOG) 0.1 %    Sig: Apply 1 Application topically 2 (two) times daily.    Dispense:  30 g    Refill:  0   cetirizine (ZYRTEC ALLERGY) 10 MG tablet    Sig: Take 1 tablet (10 mg total) by mouth at bedtime.    Dispense:  30 tablet    Refill:  0      HOME CARE:  Take cool showers and avoid direct sunlight. Apply cool compress or wet dressings. Take a bath in an oatmeal bath.  Sprinkle content of one Aveeno packet under running faucet with comfortably warm water.  Bathe for 15-20 minutes, 1-2 times daily.  Pat dry with a towel. Do not rub the rash.   GET HELP RIGHT AWAY IF:  Symptoms don't go away after treatment. Severe itching that persists. If you rash spreads or swells. If you rash begins to smell. If it blisters and opens or develops a yellow-brown crust. You develop a fever. You have a sore throat. You become short of breath.  MAKE SURE YOU:  Understand these instructions. Will watch your condition. Will get help right away if you are not doing well or get worse.  Thank you for choosing an e-visit.  Your e-visit answers were reviewed by a board certified advanced clinical practitioner to complete your personal care plan. Depending upon the  condition, your plan could have included both over the counter or prescription medications.  Please review your pharmacy choice. Make sure the pharmacy is open so you can pick up prescription now. If there is a problem, you may contact your provider through [employer] and have the prescription routed to another pharmacy.  Your safety is important to [place]. If you have drug allergies check your prescription carefully.   For the next 24 hours you can use MyChart to ask questions about today's visit, request a non-urgent call back, or ask for a work or school excuse. You will get an email in the next two days asking about your experience. I hope that your e-visit has been valuable and will speed your recovery.   I spent approximately 5 minutes reviewing the patient's history, current symptoms and coordinating their care today.

## 2023-08-09 ENCOUNTER — Other Ambulatory Visit: Payer: Self-pay | Admitting: Nurse Practitioner

## 2023-08-09 DIAGNOSIS — L249 Irritant contact dermatitis, unspecified cause: Secondary | ICD-10-CM

## 2023-10-16 ENCOUNTER — Encounter: Payer: Self-pay | Admitting: Family Medicine

## 2023-10-16 ENCOUNTER — Ambulatory Visit
Admission: RE | Admit: 2023-10-16 | Discharge: 2023-10-16 | Disposition: A | Discharge status: Home / Self Care | Payer: BC Managed Care – PPO | Source: Ambulatory Visit | Attending: Family Medicine | Admitting: Family Medicine

## 2023-10-16 ENCOUNTER — Ambulatory Visit: Payer: BC Managed Care – PPO | Admitting: Family Medicine

## 2023-10-16 VITALS — BP 104/60 | HR 81 | Temp 98.3°F | Resp 16 | Ht 63.0 in | Wt 155.0 lb

## 2023-10-16 DIAGNOSIS — R051 Acute cough: Secondary | ICD-10-CM | POA: Diagnosis not present

## 2023-10-16 DIAGNOSIS — J22 Unspecified acute lower respiratory infection: Secondary | ICD-10-CM | POA: Diagnosis not present

## 2023-10-16 DIAGNOSIS — R509 Fever, unspecified: Secondary | ICD-10-CM | POA: Diagnosis present

## 2023-10-16 LAB — COMPREHENSIVE METABOLIC PANEL
ALT: 5 U/L (ref 0–35)
AST: 13 U/L (ref 0–37)
Albumin: 3.9 g/dL (ref 3.5–5.2)
Alkaline Phosphatase: 41 U/L (ref 39–117)
BUN: 8 mg/dL (ref 6–23)
CO2: 28 meq/L (ref 19–32)
Calcium: 8.8 mg/dL (ref 8.4–10.5)
Chloride: 103 meq/L (ref 96–112)
Creatinine, Ser: 0.74 mg/dL (ref 0.40–1.20)
GFR: 96.75 mL/min (ref >60.00)
Glucose, Bld: 100 mg/dL — ABNORMAL HIGH (ref 70–99)
Potassium: 3.4 meq/L — ABNORMAL LOW (ref 3.5–5.1)
Sodium: 137 meq/L (ref 135–145)
Total Bilirubin: 0.3 mg/dL (ref 0.2–1.2)
Total Protein: 6.6 g/dL (ref 6.0–8.3)

## 2023-10-16 LAB — CBC WITH DIFFERENTIAL/PLATELET
Basophils Absolute: 0.1 10*3/uL (ref 0.0–0.1)
Basophils Relative: 1 % (ref 0.0–3.0)
Eosinophils Absolute: 0 10*3/uL (ref 0.0–0.7)
Eosinophils Relative: 0.7 % (ref 0.0–5.0)
HCT: 40.1 % (ref 36.0–46.0)
Hemoglobin: 13 g/dL (ref 12.0–15.0)
Lymphocytes Relative: 13.7 % (ref 12.0–46.0)
Lymphs Abs: 0.9 10*3/uL (ref 0.7–4.0)
MCHC: 32.4 g/dL (ref 30.0–36.0)
MCV: 90.2 fL (ref 78.0–100.0)
Monocytes Absolute: 0.7 10*3/uL (ref 0.1–1.0)
Monocytes Relative: 10 % (ref 3.0–12.0)
Neutro Abs: 5.1 10*3/uL (ref 1.4–7.7)
Neutrophils Relative %: 74.6 % (ref 43.0–77.0)
Platelets: 302 10*3/uL (ref 150.0–400.0)
RBC: 4.45 Mil/uL (ref 3.87–5.11)
RDW: 17.5 % — ABNORMAL HIGH (ref 11.5–15.5)
WBC: 6.8 10*3/uL (ref 4.0–10.5)

## 2023-10-16 LAB — POCT INFLUENZA A/B
Influenza A, POC: NEGATIVE
Influenza B, POC: NEGATIVE

## 2023-10-16 LAB — POC COVID19 BINAXNOW: SARS Coronavirus 2 Ag: NEGATIVE

## 2023-10-16 MED ORDER — DOXYCYCLINE HYCLATE 100 MG PO TABS: 100.0000 mg | ORAL_TABLET | Freq: Two times a day (BID) | ORAL | 0 refills | Status: AC

## 2023-10-16 MED ORDER — SACCHAROMYCES BOULARDII 250 MG PO CAPS: 250.0000 mg | ORAL_CAPSULE | Freq: Every day | ORAL | 0 refills | Status: AC

## 2023-10-16 NOTE — Patient Instructions (Addendum)
It was a pleasure meeting you today. Thank you for allowing me to take part in your health care.  Our goals for today as we discussed include:  Go to [facility] for chest xray [address] [phone]  Start Doxycycline 100 mg two times a day for 5 day.  Be sure to cover up when out in sun as can increase skin sensitivity Start Probiotics daily and continue for 14 days after treatment with antibiotics  Increase fluid intake daily  If no improvement follow up with PCP or go to ED if worsening shortness of breath, chest pain, or unable to hydrate.    If you have any questions or concerns, please do not hesitate to call the office at [phone].  I look forward to our next visit and until then take care and stay safe.  Regards,   Dana Allan, MD   [facility]

## 2023-10-16 NOTE — Assessment & Plan Note (Signed)
Fever, chills, body aches, and cough started yesterday. No shortness of breath, chest pain, or wheezing. Possible crackles on lung exam. History of COVID infection in August. -[facility] -Order CBC. -Prescribe Doxycycline 100 mg BID -Advise patient to take probiotics daily during and for 14 days after antibiotic course if started. -Advise patient on potential photosensitivity with Doxycycline and recommend sun protection.

## 2023-10-16 NOTE — Assessment & Plan Note (Signed)
Negative for COVID and flu. 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

## 2023-10-16 NOTE — Progress Notes (Signed)
SUBJECTIVE:   Chief Complaint  Patient presents with   Fever    yesterday   Headache   Generalized Body Aches    yesterday   HPI Presents for acute visit  Discussed the use of AI scribe software for clinical note transcription with the patient, who gave verbal consent to proceed.  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 The fever peaked at 102.39F and was responsive to Tylenol. The patient also reported a headache, which has since improved. The cough, described as productive at times, worsens after lying down. The patient denied any sore throat, difficulty swallowing, ear pain, ear discharge, weakness, dizziness, diarrhea, or urinary symptoms. The patient reported feeling achy and noted that their youngest child has a similar cough but without fever. The patient had a previous COVID infection in August, which presented with a severe headache and chills. The patient has been vaccinated against both influenza and COVID. The patient denied any known allergies to antibiotics and reported taking Zyrtec for allergies.    PERTINENT PMH / PSH: As above  OBJECTIVE:  BP 104/60   Pulse 81   Temp 98.3 F (36.8 C)   Resp 16   Ht 5\' 3"  (1.6 m)   Wt 155 lb (70.3 kg)   SpO2 97%   BMI 27.46 kg/m    Physical Exam Vitals reviewed.  Constitutional:      General: She is not in acute distress.    Appearance: Normal appearance. She is normal weight. She is not ill-appearing, toxic-appearing or diaphoretic.  Eyes:     General:        Right eye: No discharge.        Left eye: No discharge.     Conjunctiva/sclera: Conjunctivae normal.  Cardiovascular:     Rate and Rhythm: Normal rate and regular rhythm.     Heart sounds: Normal heart sounds.  Pulmonary:     Effort: Pulmonary effort is normal.     Breath sounds: No wheezing.  Abdominal:     General: Bowel  sounds are normal.  Musculoskeletal:        General: Normal range of motion.  Lymphadenopathy:     Cervical: No cervical adenopathy.  Skin:    General: Skin is warm and dry.  Neurological:     General: No focal deficit present.     Mental Status: She is alert and oriented to person, place, and time. Mental status is at baseline.  Psychiatric:        Mood and Affect: Mood normal.        Behavior: Behavior normal.        Thought Content: Thought content normal.        Judgment: Judgment normal.        10/16/2023    8:45 AM 03/13/2023    8:26 AM 01/28/2021    9:01 AM 10/25/2020   11:51 AM 08/06/2020    9:49 AM  Depression screen PHQ 2/9  Decreased Interest 0 0 1 0 1  Down, Depressed, Hopeless 0 0 0 0 1  PHQ - 2 Score 0 0 1 0 2  Altered sleeping 0 0 0 0 2  Tired, decreased energy 0 0 0 0 2  Change in appetite 0 0 0 0 0  Feeling bad or failure about yourself  0 0 0 0 1  Trouble concentrating 0 0 0  0 0  Moving slowly or fidgety/restless 0 0 0 0 0  Suicidal thoughts 0 0 0 0 0  PHQ-9 Score 0 0 1 0 7  Difficult doing work/chores Not difficult at all Not difficult at all Not difficult at all  Somewhat difficult      10/16/2023    8:45 AM 03/13/2023    8:26 AM 01/28/2021    9:03 AM 10/25/2020   11:55 AM  GAD 7 : Generalized Anxiety Score  Nervous, Anxious, on Edge 0 0 1 0  Control/stop worrying 0 0 0 0  Worry too much - different things 0 0 0 0  Trouble relaxing 0 0 0 0  Restless 0 0 0 0  Easily annoyed or irritable 0 0 1 0  Afraid - awful might happen 0 0 0 0  Total GAD 7 Score 0 0 2 0  Anxiety Difficulty Not difficult at all Not difficult at all Not difficult at all     ASSESSMENT/PLAN:  LRTI (lower respiratory tract infection) Assessment & Plan: Fever, chills, body aches, and cough started yesterday. No shortness of breath, chest pain, or wheezing. Possible crackles on lung exam. History of COVID infection in August. -[facility] -Order  CBC. -Prescribe Doxycycline 100 mg BID -Advise patient to take probiotics daily during and for 14 days after antibiotic course if started. -Advise patient on potential photosensitivity with Doxycycline and recommend sun protection.   Fever, unspecified fever cause Assessment & Plan: Negative for COVID and flu. 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   Orders: -     POC COVID-19 BinaxNow -     CBC with Differential/Platelet -     Comprehensive metabolic panel -     POCT Influenza A/B -     DG Chest 2 View; Future -     Doxycycline Hyclate; Take 1 tablet (100 mg total) by mouth 2 (two) times daily for 5 days.  Dispense: 10 tablet; Refill: 0 -     Saccharomyces boulardii; Take 1 capsule (250 mg total) by mouth daily.  Dispense: 90 capsule; Refill: 0  Acute cough Assessment & Plan: Negative for COVID and flu. 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   Orders: -     POC COVID-19 BinaxNow -     CBC with Differential/Platelet -     Comprehensive metabolic panel -     POCT Influenza A/B -     DG Chest 2 View; Future -     Doxycycline Hyclate; Take 1 tablet (100 mg total) by mouth 2 (two) times daily for 5 days.  Dispense: 10 tablet; Refill: 0 -     Saccharomyces boulardii; Take 1 capsule (250 mg total) by mouth daily.  Dispense: 90 capsule; Refill: 0   Chest xray notable for mild biapical pleuroparenchymal scarring. Recommended further evaluation with CT chest.  Recommend pulmonary referral Patient to confirm if wanting to proceed with above recommendations.  Can further discuss with PCP.  PDMP reviewed  Return if symptoms worsen or fail to improve, for PCP.  Dana Allan, MD

## 2023-10-17 ENCOUNTER — Telehealth: Payer: Self-pay

## 2023-10-17 ENCOUNTER — Other Ambulatory Visit: Payer: Self-pay | Admitting: Family Medicine

## 2023-10-17 DIAGNOSIS — R053 Chronic cough: Secondary | ICD-10-CM

## 2023-10-17 LAB — POCT RAPID STREP A (OFFICE): Rapid Strep A Screen: NEGATIVE

## 2023-10-17 NOTE — Telephone Encounter (Signed)
Patient states she saw Dr. Dana Allan yesterday and was tested for flu and covid.  Patient states she was negative for both.  Patient states her child tested positive this morning for strep.  Patient states she would like to know if she should start taking the antibiotic or come in and be tested for strep.

## 2023-10-17 NOTE — Addendum Note (Signed)
Addended by: Vertis Kelch on: 10/17/2023 03:01 PM   Modules accepted: Orders

## 2023-10-17 NOTE — Telephone Encounter (Signed)
Pt coming in to get swabbed today for strep.

## 2023-10-18 ENCOUNTER — Ambulatory Visit: Payer: BC Managed Care – PPO | Admitting: Nurse Practitioner

## 2023-10-23 ENCOUNTER — Ambulatory Visit: Admission: RE | Admit: 2023-10-23 | Payer: BC Managed Care – PPO | Source: Ambulatory Visit

## 2023-11-22 ENCOUNTER — Ambulatory Visit: Payer: BC Managed Care – PPO | Admitting: Nurse Practitioner

## 2023-11-22 ENCOUNTER — Encounter: Payer: Self-pay | Admitting: Nurse Practitioner

## 2023-11-22 VITALS — BP 100/70 | HR 73 | Temp 98.4°F | Ht 63.0 in | Wt 159.2 lb

## 2023-11-22 DIAGNOSIS — L03031 Cellulitis of right toe: Secondary | ICD-10-CM

## 2023-11-22 MED ORDER — SULFAMETHOXAZOLE-TRIMETHOPRIM 800-160 MG PO TABS: 1.0000 | ORAL_TABLET | Freq: Two times a day (BID) | ORAL | 0 refills | Status: DC

## 2023-11-22 NOTE — Progress Notes (Signed)
   Acute Office Visit  Subjective:     Patient ID: Adrienne Richards, female    DOB: Mar 11, 1976, 47 y.o.   MRN: [id number]  Chief Complaint  Patient presents with   Nail Problem    Pt complains of having pedicure done Saturday, states toe started to hurt Tuesday. States that that toe on right foot is tender, red and sore.     HPI Patient is in today for 2 issues with a history of depression   Pedicure done on Saturday and the toe started bothering her on Tuesday. States that it is painful to the touch. It is the second toe on right foot.  Patient is concerned that infection is starting she is pursuing country next couple weeks to go to the [place].  Patient does have a history of ingrown toenails but on the great toe and the second toe.  Patient is been wearing wide toe box shoes to see if this will help without resolve.   Review of Systems  Constitutional:  Negative for chills and fever.  Respiratory:  Negative for shortness of breath.   Cardiovascular:  Negative for chest pain.  Musculoskeletal:  Negative for joint pain.  Skin:  Negative for itching and rash.       Erythema "+"  Neurological:  Negative for headaches.        Objective:    BP 100/70   Pulse 73   Temp 98.4 F (36.9 C) (Oral)   Ht 5\' 3"  (1.6 m)   Wt 159 lb 3.2 oz (72.2 kg)   SpO2 98%   BMI 28.20 kg/m    Physical Exam Vitals and nursing note reviewed.  Constitutional:      Appearance: Normal appearance.  Cardiovascular:     Rate and Rhythm: Normal rate and regular rhythm.     Heart sounds: Normal heart sounds.  Pulmonary:     Effort: Pulmonary effort is normal.     Breath sounds: Normal breath sounds.  Musculoskeletal:        General: No tenderness.       Feet:  Neurological:     Mental Status: She is alert.     No results found for any visits on 11/22/23.      Assessment & Plan:   Problem List Items Addressed This Visit       Musculoskeletal and Integument   Paronychia of  second toe of right foot - Primary    Will do a WASP prescription for paronychia of the toe.  Signs and symptoms reviewed when to start taking the antibiotic.  Follow-up if no improvement patient can do warm Epsom salt soaks      Relevant Medications   sulfamethoxazole-trimethoprim (BACTRIM DS) 800-160 MG tablet    Meds ordered this encounter  Medications   sulfamethoxazole-trimethoprim (BACTRIM DS) 800-160 MG tablet    Sig: Take 1 tablet by mouth 2 (two) times daily.    Dispense:  14 tablet    Refill:  0    Order Specific Question:   Supervising Provider    Answer:   Roxy Manns A [[id number]]    Return in about 4 months (around 03/22/2024) for CPE and Labs.  Audria Nine, NP

## 2023-11-22 NOTE — Assessment & Plan Note (Signed)
Will do a WASP prescription for paronychia of the toe.  Signs and symptoms reviewed when to start taking the antibiotic.  Follow-up if no improvement patient can do warm Epsom salt soaks

## 2023-11-23 ENCOUNTER — Encounter: Payer: Self-pay | Admitting: Pulmonary Disease

## 2023-11-23 ENCOUNTER — Ambulatory Visit: Payer: BC Managed Care – PPO | Admitting: Pulmonary Disease

## 2023-11-23 VITALS — BP 100/66 | HR 83 | Temp 98.0°F | Ht 63.0 in | Wt 159.0 lb

## 2023-11-23 DIAGNOSIS — Z8616 Personal history of COVID-19: Secondary | ICD-10-CM | POA: Diagnosis not present

## 2023-11-23 DIAGNOSIS — R053 Chronic cough: Secondary | ICD-10-CM | POA: Diagnosis not present

## 2023-11-23 LAB — NITRIC OXIDE: Nitric Oxide: 8

## 2023-11-23 MED ORDER — BUDESONIDE-FORMOTEROL FUMARATE 160-4.5 MCG/ACT IN AERO: 2.0000 | INHALATION_SPRAY | Freq: Two times a day (BID) | RESPIRATORY_TRACT | 12 refills | Status: DC

## 2023-11-23 NOTE — Progress Notes (Signed)
Synopsis: Referred in by Dana Allan, MD   Subjective:   PATIENT ID: Adrienne Richards GENDER: female DOB: Sep 17, 1976, MRN: [id number]  Chief Complaint  Patient presents with   Consult    Dry cough x 4-5 months, after testing positive for COVID. Occasional shortness of breath and wheezing.     HPI Adrienne Richards is a 47 year old female patient with a past medical history of anxiety presenting today to the pulmonary clinic as a referral from her primary care provider Dr. Clent Ridges for further evaluation of chronic cough.  She had COVID 6 months ago did not require hospitalization however post-COVID she started having persistent dry cough.  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  She recently had a LRVI with fever cough and congestion.  And therefore underwent a chest x-ray on 10/29 that appeared overall normal but did show some mild biapical scarring.  Therefore she was put in for a CT chest.  She has recovered from that infection however she does still have lingering dry cough but also improving.  She denies any diagnosis of asthma.  She denies any shortness of breath but does report some chest tightness in the upper chest area specifically when taking a deep breath.  She denies any acid reflux.  She does report seasonal allergic rhinitis.  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  She lives at home with her husband 3 kids and has 1 cat.    ROS All systems were reviewed and are negative except for the above. Objective:   Vitals:   11/23/23 0928  BP: 100/66  Pulse: 83  Temp: 98 F (36.7 C)  TempSrc: Temporal  SpO2: 98%  Weight: 159 lb (72.1 kg)  Height: 5\' 3"  (1.6 m)   98% on RA BMI Readings from Last 3 Encounters:  11/23/23 28.17 kg/m  11/22/23 28.20 kg/m   10/16/23 27.46 kg/m   Wt Readings from Last 3 Encounters:  11/23/23 159 lb (72.1 kg)  11/22/23 159 lb 3.2 oz (72.2 kg)  10/16/23 155 lb (70.3 kg)    Physical Exam GEN: NAD, Healthy Appearing HEENT: Supple Neck, Reactive Pupils, EOMI  CVS: Normal S1, Normal S2, RRR, No murmurs or ES appreciated  Lungs: Clear bilateral air entry.  Abdomen: Soft, non tender, non distended, + BS  Extremities: Warm and well perfused, No edema  Skin: No suspicious lesions appreciated  Psych: Normal Affect  Labs and imaging were reviewed.  Ancillary Information   CBC    Component Value Date/Time   WBC 6.8 10/16/2023 0936   RBC 4.45 10/16/2023 0936   HGB 13.0 10/16/2023 0936   HGB 11.2 03/21/2017 0845   HCT 40.1 10/16/2023 0936   HCT 32.7 (L) 03/21/2017 0845   PLT 302.0 10/16/2023 0936   PLT 286 03/21/2017 0845   MCV 90.2 10/16/2023 0936   MCV 92 03/21/2017 0845   MCV 95 01/29/2014 1309   MCH 28.5 06/13/2017 0609   MCHC 32.4 10/16/2023 0936   RDW 17.5 (H) 10/16/2023 0936   RDW 13.4 03/21/2017 0845   RDW 13.5 01/29/2014 1309   LYMPHSABS 0.9 10/16/2023 0936   LYMPHSABS 1.4 03/21/2017 0845   LYMPHSABS 1.5 01/29/2014 1309   MONOABS 0.7 10/16/2023 0936   MONOABS 0.6 01/29/2014 1309  EOSABS 0.0 10/16/2023 0936   EOSABS 0.1 03/21/2017 0845   EOSABS 0.0 01/29/2014 1309   BASOSABS 0.1 10/16/2023 0936   BASOSABS 0.0 03/21/2017 0845   BASOSABS 0.1 01/29/2014 1309        No data to display           Assessment & Plan:  Adrienne Richards is a 47 year old female patient with a past medical history of anxiety presenting today to the pulmonary clinic as a referral from her primary care provider Dr. Clent Ridges for further evaluation of chronic cough.  #Chronic Cough  Post COVID infection 6 months ago.  Described as dry and intermittent associated with chest tightness when taking a deep breath.  She denies any postnasal drip.  Denies any acid reflux symptoms.  Possibly representing post-COVID reactive  airway disease or cough variant asthma.  Feno 8.  []  Obtain pulmonary function test []  Start budesonide-formoterol [Symbicort] 2 puffs twice a day. []  Agree with CT chest without contrast.  Return in about 3 months (around 02/21/2024).  I spent 60 minutes caring for this patient today, including preparing to see the patient, obtaining a medical history , reviewing a separately obtained history, performing a medically appropriate examination and/or evaluation, counseling and educating the patient/family/caregiver, referring and communicating with other health care professionals (not separately reported), documenting clinical information in the electronic health record, and independently interpreting results (not separately reported/billed) and communicating results to the patient/family/caregiver  Janann Colonel, MD [facility] 11/23/2023 10:04 AM

## 2023-11-27 ENCOUNTER — Encounter: Payer: Self-pay | Admitting: Pulmonary Disease

## 2023-11-27 DIAGNOSIS — R053 Chronic cough: Secondary | ICD-10-CM

## 2023-11-27 MED ORDER — BUDESONIDE-FORMOTEROL FUMARATE 160-4.5 MCG/ACT IN AERO: 2.0000 | INHALATION_SPRAY | Freq: Two times a day (BID) | RESPIRATORY_TRACT | 3 refills | Status: AC

## 2024-01-07 ENCOUNTER — Ambulatory Visit: Payer: BC Managed Care – PPO | Admitting: Dermatology

## 2024-06-09 ENCOUNTER — Ambulatory Visit: Payer: BC Managed Care – PPO | Admitting: Dermatology

## 2024-06-09 DIAGNOSIS — L578 Other skin changes due to chronic exposure to nonionizing radiation: Secondary | ICD-10-CM

## 2024-06-09 DIAGNOSIS — Z1283 Encounter for screening for malignant neoplasm of skin: Secondary | ICD-10-CM | POA: Diagnosis not present

## 2024-06-09 DIAGNOSIS — W908XXA Exposure to other nonionizing radiation, initial encounter: Secondary | ICD-10-CM | POA: Diagnosis not present

## 2024-06-09 DIAGNOSIS — D1801 Hemangioma of skin and subcutaneous tissue: Secondary | ICD-10-CM

## 2024-06-09 DIAGNOSIS — L814 Other melanin hyperpigmentation: Secondary | ICD-10-CM

## 2024-06-09 DIAGNOSIS — D2271 Melanocytic nevi of right lower limb, including hip: Secondary | ICD-10-CM

## 2024-06-09 DIAGNOSIS — L821 Other seborrheic keratosis: Secondary | ICD-10-CM

## 2024-06-09 DIAGNOSIS — D225 Melanocytic nevi of trunk: Secondary | ICD-10-CM

## 2024-06-09 DIAGNOSIS — D229 Melanocytic nevi, unspecified: Secondary | ICD-10-CM

## 2024-06-09 DIAGNOSIS — Z808 Family history of malignant neoplasm of other organs or systems: Secondary | ICD-10-CM

## 2024-06-09 NOTE — Progress Notes (Signed)
   New Patient Visit   Subjective  Adrienne Richards is a 48 y.o. female who presents for the following: Skin Cancer Screening and Full Body Skin Exam no hx of skin cancer, father with hx of skin cancer  The patient presents for Total-Body Skin Exam (TBSE) for skin cancer screening and mole check. The patient has spots, moles and lesions to be evaluated, some may be new or changing and the patient may have concern these could be cancer.  New patient referral from Lynwood Crandall, NP.  The following portions of the chart were reviewed this encounter and updated as appropriate: medications, allergies, medical history  Review of Systems:  No other skin or systemic complaints except as noted in HPI or Assessment and Plan.  Objective  Well appearing patient in no apparent distress; mood and affect are within normal limits.  A full examination was performed including scalp, head, eyes, ears, nose, lips, neck, chest, axillae, abdomen, back, buttocks, bilateral upper extremities, bilateral lower extremities, hands, feet, fingers, toes, fingernails, and toenails. All findings within normal limits unless otherwise noted below.   Exam of toenails limited by presence of nail polish.   Relevant physical exam findings are noted in the Assessment and Plan.    Assessment & Plan   SKIN CANCER SCREENING PERFORMED TODAY.  ACTINIC DAMAGE chest - Chronic condition, secondary to cumulative UV/sun exposure - diffuse scaly erythematous macules with underlying dyspigmentation - Recommend daily broad spectrum sunscreen SPF 30+ to sun-exposed areas, reapply every 2 hours as needed.  - Staying in the shade or wearing long sleeves, sun glasses (UVA+UVB protection) and wide brim hats (4-inch brim around the entire circumference of the hat) are also recommended for sun protection.  - Call for new or changing lesions.  LENTIGINES, SEBORRHEIC KERATOSES, HEMANGIOMAS - Benign normal skin lesions - Benign-appearing -  Call for any changes Lentigo L infraocular 2.15mm med brown macule  MELANOCYTIC NEVI - Tan-brown and/or pink-flesh-colored symmetric macules and papules - Spinal mid back 0.5cm brown macule - Spinal mid upper back 0.6cm med light brown speckled macule - R 3rd toe 2.18mm brown pap darker center - R lower back 4.38mm light brown pap (Irritated, discussed shave removal, pt declines) - Benign appearing on exam today, no changes per pt - Observation - Call clinic for new or changing moles - Recommend daily use of broad spectrum spf 30+ sunscreen to sun-exposed areas.    FAMILY HISTORY OF SKIN CANCER What type(s):Skin Cancer Who affected: father     Return for 1-2 yrs TBSE.  I, Grayce Saunas, RMA, am acting as scribe for Rexene Rattler, MD .   Documentation: I have reviewed the above documentation for accuracy and completeness, and I agree with the above.  Rexene Rattler, MD

## 2024-06-09 NOTE — Patient Instructions (Addendum)

## 2025-06-15 ENCOUNTER — Encounter: Admitting: Dermatology
